# Patient Record
Sex: Male | Born: 1935 | Race: White | Hispanic: No | Marital: Married | Smoking: Never smoker
Health system: Southern US, Community
[De-identification: ages and names within clinical notes are randomized; demographics above are authoritative.]

## PROBLEM LIST (undated history)

## (undated) DIAGNOSIS — E119 Type 2 diabetes mellitus without complications: Secondary | ICD-10-CM

## (undated) DIAGNOSIS — I2510 Atherosclerotic heart disease of native coronary artery without angina pectoris: Secondary | ICD-10-CM

## (undated) HISTORY — PX: OTHER SURGICAL HISTORY: SHX169

---

## 2001-02-08 ENCOUNTER — Encounter: Payer: Self-pay | Admitting: Neurosurgery

## 2001-02-13 ENCOUNTER — Encounter: Payer: Self-pay | Admitting: Neurosurgery

## 2001-02-13 ENCOUNTER — Ambulatory Visit (HOSPITAL_COMMUNITY): Admission: RE | Admit: 2001-02-13 | Discharge: 2001-02-14 | Payer: Self-pay | Admitting: Neurosurgery

## 2003-01-30 ENCOUNTER — Ambulatory Visit (HOSPITAL_COMMUNITY): Admission: RE | Admit: 2003-01-30 | Discharge: 2003-01-30 | Payer: Self-pay | Admitting: Internal Medicine

## 2004-12-31 ENCOUNTER — Emergency Department: Payer: Self-pay | Admitting: Emergency Medicine

## 2005-10-17 ENCOUNTER — Other Ambulatory Visit: Payer: Self-pay

## 2005-10-18 ENCOUNTER — Inpatient Hospital Stay: Payer: Self-pay | Admitting: Internal Medicine

## 2005-10-18 ENCOUNTER — Other Ambulatory Visit: Payer: Self-pay

## 2005-10-19 ENCOUNTER — Other Ambulatory Visit: Payer: Self-pay

## 2006-07-29 ENCOUNTER — Ambulatory Visit: Payer: Self-pay | Admitting: Gastroenterology

## 2006-09-20 ENCOUNTER — Ambulatory Visit: Payer: Self-pay | Admitting: Gastroenterology

## 2007-07-14 ENCOUNTER — Ambulatory Visit: Payer: Self-pay

## 2007-07-27 ENCOUNTER — Ambulatory Visit: Payer: Self-pay | Admitting: Pain Medicine

## 2007-08-13 ENCOUNTER — Other Ambulatory Visit: Payer: Self-pay

## 2007-08-13 ENCOUNTER — Emergency Department: Payer: Self-pay | Admitting: Emergency Medicine

## 2007-09-15 ENCOUNTER — Ambulatory Visit: Payer: Self-pay | Admitting: Internal Medicine

## 2007-10-12 ENCOUNTER — Encounter: Payer: Self-pay | Admitting: Otolaryngology

## 2008-04-03 ENCOUNTER — Encounter: Payer: Self-pay | Admitting: Unknown Physician Specialty

## 2008-04-09 ENCOUNTER — Encounter: Payer: Self-pay | Admitting: Unknown Physician Specialty

## 2010-02-26 ENCOUNTER — Ambulatory Visit: Payer: Self-pay | Admitting: Gastroenterology

## 2010-03-02 LAB — PATHOLOGY REPORT

## 2011-03-23 ENCOUNTER — Ambulatory Visit: Payer: Self-pay | Admitting: Internal Medicine

## 2011-08-11 ENCOUNTER — Emergency Department: Payer: Self-pay | Admitting: Emergency Medicine

## 2011-08-11 LAB — URINALYSIS, COMPLETE
Bacteria: NONE SEEN
Bilirubin,UR: NEGATIVE
Blood: NEGATIVE
Glucose,UR: NEGATIVE mg/dL (ref 0–75)
Ketone: NEGATIVE
Leukocyte Esterase: NEGATIVE
Nitrite: NEGATIVE
Ph: 5 (ref 4.5–8.0)
Protein: NEGATIVE
RBC,UR: NONE SEEN /HPF (ref 0–5)
Specific Gravity: 1.012 (ref 1.003–1.030)
Squamous Epithelial: NONE SEEN
WBC UR: NONE SEEN /HPF (ref 0–5)

## 2011-08-11 LAB — CBC
HCT: 40.8 % (ref 40.0–52.0)
HGB: 13.8 g/dL (ref 13.0–18.0)
MCH: 31.9 pg (ref 26.0–34.0)
MCHC: 33.8 g/dL (ref 32.0–36.0)
MCV: 94 fL (ref 80–100)
Platelet: 199 10*3/uL (ref 150–440)
RBC: 4.34 10*6/uL — ABNORMAL LOW (ref 4.40–5.90)
RDW: 13.3 % (ref 11.5–14.5)
WBC: 9.4 10*3/uL (ref 3.8–10.6)

## 2011-08-11 LAB — COMPREHENSIVE METABOLIC PANEL
Albumin: 3.6 g/dL (ref 3.4–5.0)
Alkaline Phosphatase: 63 U/L (ref 50–136)
Anion Gap: 6 — ABNORMAL LOW (ref 7–16)
BUN: 19 mg/dL — ABNORMAL HIGH (ref 7–18)
Bilirubin,Total: 0.3 mg/dL (ref 0.2–1.0)
Calcium, Total: 8.9 mg/dL (ref 8.5–10.1)
Chloride: 104 mmol/L (ref 98–107)
Co2: 28 mmol/L (ref 21–32)
Creatinine: 1.22 mg/dL (ref 0.60–1.30)
EGFR (African American): >60
EGFR (Non-African Amer.): >60
Glucose: 185 mg/dL — ABNORMAL HIGH (ref 65–99)
Osmolality: 283 (ref 275–301)
Potassium: 3.9 mmol/L (ref 3.5–5.1)
SGOT(AST): 20 U/L (ref 15–37)
SGPT (ALT): 33 U/L
Sodium: 138 mmol/L (ref 136–145)
Total Protein: 6.8 g/dL (ref 6.4–8.2)

## 2011-08-11 LAB — CK TOTAL AND CKMB (NOT AT ARMC)
CK, Total: 137 U/L (ref 35–232)
CK-MB: 2.9 ng/mL (ref 0.5–3.6)

## 2011-08-11 LAB — TROPONIN I: Troponin-I: <0.02 ng/mL

## 2011-08-16 ENCOUNTER — Encounter: Payer: Self-pay | Admitting: Unknown Physician Specialty

## 2011-09-10 ENCOUNTER — Encounter: Payer: Self-pay | Admitting: Unknown Physician Specialty

## 2011-10-08 ENCOUNTER — Encounter: Payer: Self-pay | Admitting: Unknown Physician Specialty

## 2011-11-08 ENCOUNTER — Encounter: Payer: Self-pay | Admitting: Unknown Physician Specialty

## 2011-12-08 ENCOUNTER — Encounter: Payer: Self-pay | Admitting: Unknown Physician Specialty

## 2011-12-22 ENCOUNTER — Emergency Department: Payer: Self-pay | Admitting: Unknown Physician Specialty

## 2011-12-22 LAB — COMPREHENSIVE METABOLIC PANEL
Albumin: 3.4 g/dL (ref 3.4–5.0)
Alkaline Phosphatase: 92 U/L (ref 50–136)
Anion Gap: 8 (ref 7–16)
BUN: 15 mg/dL (ref 7–18)
Bilirubin,Total: 0.7 mg/dL (ref 0.2–1.0)
Calcium, Total: 8.4 mg/dL — ABNORMAL LOW (ref 8.5–10.1)
Co2: 29 mmol/L (ref 21–32)
Creatinine: 1.03 mg/dL (ref 0.60–1.30)
EGFR (African American): >60
EGFR (Non-African Amer.): >60
Glucose: 206 mg/dL — ABNORMAL HIGH (ref 65–99)
Osmolality: 268 (ref 275–301)
Potassium: 3.8 mmol/L (ref 3.5–5.1)
SGOT(AST): 39 U/L — ABNORMAL HIGH (ref 15–37)
SGPT (ALT): 40 U/L
Sodium: 130 mmol/L — ABNORMAL LOW (ref 136–145)
Total Protein: 6.7 g/dL (ref 6.4–8.2)

## 2011-12-22 LAB — CBC WITH DIFFERENTIAL/PLATELET
Basophil #: 0 10*3/uL (ref 0.0–0.1)
Eosinophil #: 0.1 10*3/uL (ref 0.0–0.7)
Eosinophil %: 0.5 %
HCT: 36.5 % — ABNORMAL LOW (ref 40.0–52.0)
HGB: 12.5 g/dL — ABNORMAL LOW (ref 13.0–18.0)
Lymphocyte #: 1.8 10*3/uL (ref 1.0–3.6)
MCH: 31.7 pg (ref 26.0–34.0)
MCHC: 34.3 g/dL (ref 32.0–36.0)
Neutrophil #: 10.7 10*3/uL — ABNORMAL HIGH (ref 1.4–6.5)
Neutrophil %: 74.7 %
Platelet: 167 10*3/uL (ref 150–440)
RBC: 3.95 10*6/uL — ABNORMAL LOW (ref 4.40–5.90)
RDW: 13.3 % (ref 11.5–14.5)
WBC: 14.3 10*3/uL — ABNORMAL HIGH (ref 3.8–10.6)

## 2011-12-22 LAB — URINALYSIS, COMPLETE
Bacteria: NONE SEEN
Bilirubin,UR: NEGATIVE
Ph: 6 (ref 4.5–8.0)
RBC,UR: <1 /HPF (ref 0–5)
Squamous Epithelial: NONE SEEN

## 2011-12-22 LAB — MAGNESIUM: Magnesium: 1.4 mg/dL — ABNORMAL LOW

## 2011-12-22 LAB — PROTIME-INR: INR: 1.1

## 2011-12-22 LAB — APTT: Activated PTT: 33.9 secs (ref 23.6–35.9)

## 2011-12-28 LAB — CULTURE, BLOOD (SINGLE)

## 2012-02-03 ENCOUNTER — Encounter: Payer: Self-pay | Admitting: Orthopedic Surgery

## 2012-02-07 ENCOUNTER — Encounter: Payer: Self-pay | Admitting: Orthopedic Surgery

## 2012-03-09 ENCOUNTER — Encounter: Payer: Self-pay | Admitting: Orthopedic Surgery

## 2012-04-09 ENCOUNTER — Encounter: Payer: Self-pay | Admitting: Orthopedic Surgery

## 2012-09-08 ENCOUNTER — Encounter: Payer: Self-pay | Admitting: Endocrinology

## 2012-09-12 ENCOUNTER — Encounter: Payer: Self-pay | Admitting: Internal Medicine

## 2012-09-13 ENCOUNTER — Ambulatory Visit: Payer: Self-pay | Admitting: Endocrinology

## 2012-10-27 ENCOUNTER — Ambulatory Visit: Payer: Self-pay | Admitting: Neurology

## 2012-11-08 ENCOUNTER — Ambulatory Visit: Payer: Self-pay | Admitting: Internal Medicine

## 2012-11-09 ENCOUNTER — Ambulatory Visit: Payer: Self-pay | Admitting: Internal Medicine

## 2012-11-11 ENCOUNTER — Ambulatory Visit: Payer: Self-pay | Admitting: Physical Medicine and Rehabilitation

## 2013-02-28 DIAGNOSIS — N138 Other obstructive and reflux uropathy: Secondary | ICD-10-CM | POA: Insufficient documentation

## 2013-02-28 DIAGNOSIS — N3941 Urge incontinence: Secondary | ICD-10-CM | POA: Insufficient documentation

## 2013-02-28 DIAGNOSIS — R350 Frequency of micturition: Secondary | ICD-10-CM | POA: Insufficient documentation

## 2013-02-28 DIAGNOSIS — R339 Retention of urine, unspecified: Secondary | ICD-10-CM | POA: Insufficient documentation

## 2013-02-28 DIAGNOSIS — N182 Chronic kidney disease, stage 2 (mild): Secondary | ICD-10-CM | POA: Insufficient documentation

## 2014-01-31 DIAGNOSIS — E559 Vitamin D deficiency, unspecified: Secondary | ICD-10-CM | POA: Insufficient documentation

## 2014-05-21 DIAGNOSIS — E119 Type 2 diabetes mellitus without complications: Secondary | ICD-10-CM | POA: Insufficient documentation

## 2014-05-21 DIAGNOSIS — E538 Deficiency of other specified B group vitamins: Secondary | ICD-10-CM | POA: Insufficient documentation

## 2014-07-23 ENCOUNTER — Ambulatory Visit: Payer: Self-pay | Admitting: Podiatry

## 2014-08-20 DIAGNOSIS — C44320 Squamous cell carcinoma of skin of unspecified parts of face: Secondary | ICD-10-CM | POA: Insufficient documentation

## 2014-10-15 DIAGNOSIS — F0390 Unspecified dementia without behavioral disturbance: Secondary | ICD-10-CM | POA: Insufficient documentation

## 2014-10-15 DIAGNOSIS — M5000 Cervical disc disorder with myelopathy, unspecified cervical region: Secondary | ICD-10-CM | POA: Insufficient documentation

## 2016-01-19 ENCOUNTER — Encounter: Payer: Self-pay | Admitting: Podiatry

## 2016-01-19 ENCOUNTER — Ambulatory Visit (INDEPENDENT_AMBULATORY_CARE_PROVIDER_SITE_OTHER): Payer: Medicare Other

## 2016-01-19 ENCOUNTER — Ambulatory Visit (INDEPENDENT_AMBULATORY_CARE_PROVIDER_SITE_OTHER): Payer: Medicare Other | Admitting: Podiatry

## 2016-01-19 VITALS — BP 133/71 | HR 65 | Resp 16

## 2016-01-19 DIAGNOSIS — B351 Tinea unguium: Secondary | ICD-10-CM

## 2016-01-19 DIAGNOSIS — E119 Type 2 diabetes mellitus without complications: Secondary | ICD-10-CM

## 2016-01-19 DIAGNOSIS — M79676 Pain in unspecified toe(s): Secondary | ICD-10-CM | POA: Diagnosis not present

## 2016-01-19 NOTE — Progress Notes (Signed)
   Subjective:    Patient ID: Larry Daugherty, male    DOB: [birth year], 82 y.o.   MRN: [id number]  HPI: He has a wife present today with a chief complaint of diabetes and painful toes bilateral. States that his toenails are long and painful in the [place]. They deny any trauma or any open wounds to his feet. Vitals    Review of Systems  Constitutional: Positive for fatigue.  HENT: Positive for hearing loss.   Eyes: Positive for itching and visual disturbance.  Respiratory: Positive for cough.   Gastrointestinal: Positive for diarrhea and abdominal distention.  Endocrine: Positive for polyuria.  Genitourinary: Positive for urgency.  Musculoskeletal: Positive for back pain.  Neurological: Positive for dizziness and weakness.  Psychiatric/Behavioral: Positive for confusion.  All other systems reviewed and are negative.      Objective:   Physical Exam: Vital signs are stable alert and oriented 3. Pulses are palpable. Neurologic systems intact. Degenerative flexion intact. Muscle strength is normal bilateral. Orthopedic evaluation demonstrates DISTAL ANKLE FULL RANGE OF MOTION WITHOUT CREPITATION. CUTANEOUS EVALUATION SHOWS SUPPLE HIGH-GRADE CUTIS NO ERYTHEMA EDEMA CELLULITIS OR DRAINAGE OR ODOR. HIS TOENAILS ARE THICK YELLOW DYSTROPHIC MYCOTIC AND PAINFUL PALPATION. NO OPEN LESIONS OR WOUNDS ARE NOTED.        Assessment & Plan:  Onychomycosis.  Plan: Debridement of toenails 1 through 5 bilateral. Non-complicated diabetes.

## 2016-04-26 ENCOUNTER — Encounter: Payer: Self-pay | Admitting: Podiatry

## 2016-04-26 ENCOUNTER — Ambulatory Visit (INDEPENDENT_AMBULATORY_CARE_PROVIDER_SITE_OTHER): Payer: Medicare Other | Admitting: Podiatry

## 2016-04-26 DIAGNOSIS — B351 Tinea unguium: Secondary | ICD-10-CM | POA: Diagnosis not present

## 2016-04-26 DIAGNOSIS — M79676 Pain in unspecified toe(s): Secondary | ICD-10-CM | POA: Diagnosis not present

## 2016-04-26 NOTE — Progress Notes (Signed)
He presents today with a chief complaint of painful elongated toenails 1 through 5 bilateral.  Objective: Toenails are thick yellow dystrophic with mycotic painful palpation 1 through 5 bilateral.  Assessment: Pain limb secondary to onychomycosis 1 through 5 bilateral.  Plan: Debridement of toenails 1 through 5 bilateral.

## 2016-07-28 ENCOUNTER — Ambulatory Visit (INDEPENDENT_AMBULATORY_CARE_PROVIDER_SITE_OTHER): Payer: Medicare Other | Admitting: Podiatry

## 2016-07-28 DIAGNOSIS — M79676 Pain in unspecified toe(s): Secondary | ICD-10-CM | POA: Diagnosis not present

## 2016-07-28 DIAGNOSIS — B351 Tinea unguium: Secondary | ICD-10-CM | POA: Diagnosis not present

## 2016-07-28 DIAGNOSIS — E1142 Type 2 diabetes mellitus with diabetic polyneuropathy: Secondary | ICD-10-CM

## 2016-07-28 DIAGNOSIS — E1151 Type 2 diabetes mellitus with diabetic peripheral angiopathy without gangrene: Secondary | ICD-10-CM

## 2016-07-29 ENCOUNTER — Telehealth: Payer: Self-pay | Admitting: *Deleted

## 2016-07-29 DIAGNOSIS — E1151 Type 2 diabetes mellitus with diabetic peripheral angiopathy without gangrene: Secondary | ICD-10-CM

## 2016-07-29 NOTE — Telephone Encounter (Addendum)
-----   Message from Mack Hook, LPN sent at [phone]  4:59 PM EST ----- Regarding: Vascular study and consult Ulcer left fibula and rest pain-needs vascular consult and studies. 07/29/2016-FAxed orders and referral with demographics and clinicals to [facility].

## 2016-07-29 NOTE — Progress Notes (Signed)
He presents today with chief complaint of painful elongated toenails 1 through 5 bilateral.  Objective: Toenails are thick yellow dystrophic onychomycotic no ulcerations or wounds are noted. He does have a very small superficial wound to the lateral ankle that bothers him at nighttime. Pulses are minimally palpable bilateral and his feet appear to be slightly mottled.  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 Pain in limb segment onychomycosis.  Plan: Debrided all reactive hyperkeratotic tissue debrided nails 1 through 5 bilateral. Also referred him to vascular clinic.

## 2016-08-11 ENCOUNTER — Emergency Department: Payer: Medicare Other

## 2016-08-11 ENCOUNTER — Encounter: Payer: Self-pay | Admitting: Emergency Medicine

## 2016-08-11 ENCOUNTER — Inpatient Hospital Stay
Admission: EM | Admit: 2016-08-11 | Discharge: 2016-09-09 | DRG: 441 | Disposition: E | Payer: Medicare Other | Attending: Internal Medicine | Admitting: Internal Medicine

## 2016-08-11 DIAGNOSIS — N182 Chronic kidney disease, stage 2 (mild): Secondary | ICD-10-CM | POA: Diagnosis present

## 2016-08-11 DIAGNOSIS — J811 Chronic pulmonary edema: Secondary | ICD-10-CM

## 2016-08-11 DIAGNOSIS — I5021 Acute systolic (congestive) heart failure: Secondary | ICD-10-CM | POA: Diagnosis present

## 2016-08-11 DIAGNOSIS — I255 Ischemic cardiomyopathy: Secondary | ICD-10-CM | POA: Diagnosis present

## 2016-08-11 DIAGNOSIS — B179 Acute viral hepatitis, unspecified: Secondary | ICD-10-CM

## 2016-08-11 DIAGNOSIS — E538 Deficiency of other specified B group vitamins: Secondary | ICD-10-CM | POA: Diagnosis present

## 2016-08-11 DIAGNOSIS — Z7984 Long term (current) use of oral hypoglycemic drugs: Secondary | ICD-10-CM | POA: Diagnosis not present

## 2016-08-11 DIAGNOSIS — N179 Acute kidney failure, unspecified: Secondary | ICD-10-CM | POA: Diagnosis present

## 2016-08-11 DIAGNOSIS — G9340 Encephalopathy, unspecified: Secondary | ICD-10-CM | POA: Diagnosis present

## 2016-08-11 DIAGNOSIS — G9349 Other encephalopathy: Secondary | ICD-10-CM | POA: Diagnosis not present

## 2016-08-11 DIAGNOSIS — I350 Nonrheumatic aortic (valve) stenosis: Secondary | ICD-10-CM | POA: Diagnosis present

## 2016-08-11 DIAGNOSIS — E1122 Type 2 diabetes mellitus with diabetic chronic kidney disease: Secondary | ICD-10-CM | POA: Diagnosis present

## 2016-08-11 DIAGNOSIS — Z888 Allergy status to other drugs, medicaments and biological substances status: Secondary | ICD-10-CM

## 2016-08-11 DIAGNOSIS — I2510 Atherosclerotic heart disease of native coronary artery without angina pectoris: Secondary | ICD-10-CM | POA: Diagnosis present

## 2016-08-11 DIAGNOSIS — F0390 Unspecified dementia without behavioral disturbance: Secondary | ICD-10-CM | POA: Diagnosis present

## 2016-08-11 DIAGNOSIS — R7401 Elevation of levels of liver transaminase levels: Secondary | ICD-10-CM

## 2016-08-11 DIAGNOSIS — K7200 Acute and subacute hepatic failure without coma: Principal | ICD-10-CM | POA: Diagnosis present

## 2016-08-11 DIAGNOSIS — R740 Nonspecific elevation of levels of transaminase and lactic acid dehydrogenase [LDH]: Secondary | ICD-10-CM | POA: Diagnosis present

## 2016-08-11 DIAGNOSIS — R7989 Other specified abnormal findings of blood chemistry: Secondary | ICD-10-CM | POA: Diagnosis not present

## 2016-08-11 DIAGNOSIS — Z515 Encounter for palliative care: Secondary | ICD-10-CM | POA: Diagnosis present

## 2016-08-11 DIAGNOSIS — Z7982 Long term (current) use of aspirin: Secondary | ICD-10-CM

## 2016-08-11 DIAGNOSIS — I509 Heart failure, unspecified: Secondary | ICD-10-CM

## 2016-08-11 DIAGNOSIS — R531 Weakness: Secondary | ICD-10-CM | POA: Diagnosis not present

## 2016-08-11 DIAGNOSIS — I21A1 Myocardial infarction type 2: Secondary | ICD-10-CM

## 2016-08-11 DIAGNOSIS — E875 Hyperkalemia: Secondary | ICD-10-CM | POA: Diagnosis present

## 2016-08-11 DIAGNOSIS — R945 Abnormal results of liver function studies: Secondary | ICD-10-CM

## 2016-08-11 DIAGNOSIS — R748 Abnormal levels of other serum enzymes: Secondary | ICD-10-CM

## 2016-08-11 DIAGNOSIS — Z66 Do not resuscitate: Secondary | ICD-10-CM | POA: Diagnosis present

## 2016-08-11 HISTORY — DX: Type 2 diabetes mellitus without complications: E11.9

## 2016-08-11 HISTORY — DX: Atherosclerotic heart disease of native coronary artery without angina pectoris: I25.10

## 2016-08-11 LAB — BLOOD GAS, ARTERIAL
ACID-BASE DEFICIT: 2.3 mmol/L — AB (ref 0.0–2.0)
Bicarbonate: 21.2 mmol/L (ref 20.0–28.0)
FIO2: 0.21
O2 SAT: 93.8 %
PATIENT TEMPERATURE: 37
PH ART: 7.43 (ref 7.350–7.450)
pCO2 arterial: 32 mmHg (ref 32.0–48.0)
pO2, Arterial: 68 mmHg — ABNORMAL LOW (ref 83.0–108.0)

## 2016-08-11 LAB — URINALYSIS, COMPLETE (UACMP) WITH MICROSCOPIC
Bacteria, UA: NONE SEEN
Bilirubin Urine: NEGATIVE
Hgb urine dipstick: NEGATIVE
Ketones, ur: NEGATIVE mg/dL
Leukocytes, UA: NEGATIVE
NITRITE: NEGATIVE
Protein, ur: 100 mg/dL — AB
Specific Gravity, Urine: 1.024 (ref 1.005–1.030)
pH: 5 (ref 5.0–8.0)

## 2016-08-11 LAB — RAPID INFLUENZA A&B ANTIGENS (ARMC ONLY): INFLUENZA B (ARMC): NEGATIVE

## 2016-08-11 LAB — COMPREHENSIVE METABOLIC PANEL
ALBUMIN: 3.6 g/dL (ref 3.5–5.0)
ALBUMIN: 3.3 g/dL — AB (ref 3.5–5.0)
ALK PHOS: 101 U/L (ref 38–126)
ALK PHOS: 106 U/L (ref 38–126)
ALK PHOS: 101 U/L (ref 38–126)
ALT: 1166 U/L — ABNORMAL HIGH (ref 17–63)
ALT: 1427 U/L — AB (ref 17–63)
ALT: 1820 U/L — AB (ref 17–63)
ANION GAP: 11 (ref 5–15)
ANION GAP: 11 (ref 5–15)
AST: 1455 U/L — ABNORMAL HIGH (ref 15–41)
AST: 1911 U/L — ABNORMAL HIGH (ref 15–41)
AST: 3018 U/L — AB (ref 15–41)
Albumin: 3.6 g/dL (ref 3.5–5.0)
Anion gap: 10 (ref 5–15)
BUN: 45 mg/dL — ABNORMAL HIGH (ref 6–20)
BUN: 46 mg/dL — ABNORMAL HIGH (ref 6–20)
BUN: 47 mg/dL — AB (ref 6–20)
CALCIUM: 9.5 mg/dL (ref 8.9–10.3)
CALCIUM: 9.6 mg/dL (ref 8.9–10.3)
CALCIUM: 9.2 mg/dL (ref 8.9–10.3)
CHLORIDE: 98 mmol/L — AB (ref 101–111)
CO2: 20 mmol/L — ABNORMAL LOW (ref 22–32)
CO2: 21 mmol/L — AB (ref 22–32)
CO2: 24 mmol/L (ref 22–32)
CREATININE: 1.45 mg/dL — AB (ref 0.61–1.24)
CREATININE: 1.55 mg/dL — AB (ref 0.61–1.24)
Chloride: 100 mmol/L — ABNORMAL LOW (ref 101–111)
Chloride: 99 mmol/L — ABNORMAL LOW (ref 101–111)
Creatinine, Ser: 1.49 mg/dL — ABNORMAL HIGH (ref 0.61–1.24)
GFR calc Af Amer: 49 mL/min — ABNORMAL LOW (ref >60)
GFR calc Af Amer: 46 mL/min — ABNORMAL LOW (ref >60)
GFR calc non Af Amer: 41 mL/min — ABNORMAL LOW (ref >60)
GFR calc non Af Amer: 43 mL/min — ABNORMAL LOW (ref >60)
GFR calc non Af Amer: 39 mL/min — ABNORMAL LOW (ref >60)
GFR, EST AFRICAN AMERICAN: 48 mL/min — AB (ref >60)
GLUCOSE: 285 mg/dL — AB (ref 65–99)
Glucose, Bld: 287 mg/dL — ABNORMAL HIGH (ref 65–99)
Glucose, Bld: 252 mg/dL — ABNORMAL HIGH (ref 65–99)
Potassium: 5.3 mmol/L — ABNORMAL HIGH (ref 3.5–5.1)
Potassium: 6.4 mmol/L (ref 3.5–5.1)
Potassium: 5.3 mmol/L — ABNORMAL HIGH (ref 3.5–5.1)
SODIUM: 131 mmol/L — AB (ref 135–145)
SODIUM: 131 mmol/L — AB (ref 135–145)
SODIUM: 132 mmol/L — AB (ref 135–145)
TOTAL PROTEIN: 7.2 g/dL (ref 6.5–8.1)
Total Bilirubin: 1.5 mg/dL — ABNORMAL HIGH (ref 0.3–1.2)
Total Bilirubin: 1.7 mg/dL — ABNORMAL HIGH (ref 0.3–1.2)
Total Bilirubin: 1.2 mg/dL (ref 0.3–1.2)
Total Protein: 7.5 g/dL (ref 6.5–8.1)
Total Protein: 6.7 g/dL (ref 6.5–8.1)

## 2016-08-11 LAB — TROPONIN I
TROPONIN I: 1.29 ng/mL — AB (ref <0.03)
Troponin I: 1.23 ng/mL (ref <0.03)

## 2016-08-11 LAB — CBC WITH DIFFERENTIAL/PLATELET
Basophils Absolute: 0 10*3/uL (ref 0–0.1)
Basophils Relative: 0 %
EOS ABS: 0 10*3/uL (ref 0–0.7)
Eosinophils Relative: 0 %
HCT: 39.4 % — ABNORMAL LOW (ref 40.0–52.0)
HEMOGLOBIN: 13.2 g/dL (ref 13.0–18.0)
LYMPHS ABS: 1.2 10*3/uL (ref 1.0–3.6)
Lymphocytes Relative: 7 %
MCH: 31.7 pg (ref 26.0–34.0)
MCHC: 33.5 g/dL (ref 32.0–36.0)
MCV: 94.4 fL (ref 80.0–100.0)
MONO ABS: 1.7 10*3/uL — AB (ref 0.2–1.0)
Monocytes Relative: 9 %
NEUTROS PCT: 84 %
Neutro Abs: 16.1 10*3/uL — ABNORMAL HIGH (ref 1.4–6.5)
Platelets: 263 10*3/uL (ref 150–440)
RBC: 4.17 MIL/uL — ABNORMAL LOW (ref 4.40–5.90)
RDW: 13.8 % (ref 11.5–14.5)
WBC: 19.1 10*3/uL — ABNORMAL HIGH (ref 3.8–10.6)

## 2016-08-11 LAB — ACETAMINOPHEN LEVEL: Acetaminophen (Tylenol), Serum: <10 ug/mL — ABNORMAL LOW (ref 10–30)

## 2016-08-11 LAB — GLUCOSE, CAPILLARY
GLUCOSE-CAPILLARY: 212 mg/dL — AB (ref 65–99)
Glucose-Capillary: 282 mg/dL — ABNORMAL HIGH (ref 65–99)
Glucose-Capillary: 231 mg/dL — ABNORMAL HIGH (ref 65–99)
Glucose-Capillary: 247 mg/dL — ABNORMAL HIGH (ref 65–99)

## 2016-08-11 LAB — LACTIC ACID, PLASMA
LACTIC ACID, VENOUS: 3 mmol/L — AB (ref 0.5–1.9)
Lactic Acid, Venous: 3 mmol/L (ref 0.5–1.9)

## 2016-08-11 LAB — LIPASE, BLOOD: Lipase: 30 U/L (ref 11–51)

## 2016-08-11 LAB — PROCALCITONIN: PROCALCITONIN: 0.17 ng/mL

## 2016-08-11 LAB — AMMONIA: AMMONIA: 20 umol/L (ref 9–35)

## 2016-08-11 LAB — CK: Total CK: 42 U/L — ABNORMAL LOW (ref 49–397)

## 2016-08-11 LAB — BRAIN NATRIURETIC PEPTIDE: B Natriuretic Peptide: 2992 pg/mL — ABNORMAL HIGH (ref 0.0–100.0)

## 2016-08-11 LAB — RAPID INFLUENZA A&B ANTIGENS: Influenza A (ARMC): NEGATIVE

## 2016-08-11 LAB — SALICYLATE LEVEL: Salicylate Lvl: <7 mg/dL (ref 2.8–30.0)

## 2016-08-11 LAB — MRSA PCR SCREENING: MRSA BY PCR: NEGATIVE

## 2016-08-11 LAB — APTT: aPTT: 30 seconds (ref 24–36)

## 2016-08-11 LAB — PROTIME-INR
INR: 2.06
PROTHROMBIN TIME: 23.5 s — AB (ref 11.4–15.2)

## 2016-08-11 LAB — AMYLASE: AMYLASE: 33 U/L (ref 28–100)

## 2016-08-11 MED ORDER — TIMOLOL MALEATE 0.5 % OP SOLN
1.0000 [drp] | Freq: Every day | OPHTHALMIC | Status: DC
  Administered 2016-08-11: 1 [drp] via OPHTHALMIC
  Filled 2016-08-11: qty 5

## 2016-08-11 MED ORDER — SODIUM CHLORIDE 0.9 % IV SOLN: 250.0000 mL | INTRAVENOUS | Status: DC | PRN

## 2016-08-11 MED ORDER — VITAMIN K1 10 MG/ML IJ SOLN
10.0000 mg | Freq: Every day | INTRAMUSCULAR | Status: DC
Start: 2016-08-11 — End: 2016-08-12
  Administered 2016-08-11: 10 mg via SUBCUTANEOUS
  Filled 2016-08-11: qty 1

## 2016-08-11 MED ORDER — SODIUM CHLORIDE 0.9 % IV SOLN
2.0000 g | INTRAVENOUS | Status: AC
  Administered 2016-08-11: 2 g via INTRAVENOUS
  Filled 2016-08-11: qty 2000

## 2016-08-11 MED ORDER — ONDANSETRON HCL 4 MG/2ML IJ SOLN: 4.0000 mg | Freq: Four times a day (QID) | INTRAMUSCULAR | Status: DC | PRN

## 2016-08-11 MED ORDER — LATANOPROST 0.005 % OP SOLN
1.0000 [drp] | Freq: Every day | OPHTHALMIC | Status: DC
  Administered 2016-08-11: 1 [drp] via OPHTHALMIC
  Filled 2016-08-11: qty 2.5

## 2016-08-11 MED ORDER — IPRATROPIUM-ALBUTEROL 0.5-2.5 (3) MG/3ML IN SOLN: 3.0000 mL | RESPIRATORY_TRACT | Status: DC | PRN

## 2016-08-11 MED ORDER — DEXTROSE 5 % IV SOLN
2.0000 g | Freq: Two times a day (BID) | INTRAVENOUS | Status: DC
  Administered 2016-08-12: 2 g via INTRAVENOUS
  Filled 2016-08-11 (×2): qty 2

## 2016-08-11 MED ORDER — SODIUM CHLORIDE 0.9 % IV BOLUS (SEPSIS)
500.0000 mL | Freq: Once | INTRAVENOUS | Status: AC
  Administered 2016-08-11: 500 mL via INTRAVENOUS

## 2016-08-11 MED ORDER — VANCOMYCIN HCL IN DEXTROSE 1-5 GM/200ML-% IV SOLN
1000.0000 mg | INTRAVENOUS | Status: DC
  Administered 2016-08-12: 1000 mg via INTRAVENOUS
  Filled 2016-08-11 (×3): qty 200

## 2016-08-11 MED ORDER — INSULIN ASPART 100 UNIT/ML ~~LOC~~ SOLN
0.0000 [IU] | SUBCUTANEOUS | Status: DC
  Administered 2016-08-11 (×3): 3 [IU] via SUBCUTANEOUS
  Administered 2016-08-12 (×2): 2 [IU] via SUBCUTANEOUS
  Filled 2016-08-11 (×2): qty 2
  Filled 2016-08-11 (×3): qty 3

## 2016-08-11 MED ORDER — DEXTROSE 5 % IV SOLN
2.0000 g | INTRAVENOUS | Status: AC
  Administered 2016-08-11: 2 g via INTRAVENOUS
  Filled 2016-08-11: qty 2

## 2016-08-11 MED ORDER — VANCOMYCIN HCL IN DEXTROSE 1-5 GM/200ML-% IV SOLN
1000.0000 mg | INTRAVENOUS | Status: AC
  Administered 2016-08-11: 1000 mg via INTRAVENOUS
  Filled 2016-08-11: qty 200

## 2016-08-11 MED ORDER — SODIUM CHLORIDE 0.9 % IV SOLN
INTRAVENOUS | Status: DC
  Administered 2016-08-11: 18:00:00 via INTRAVENOUS

## 2016-08-11 MED ORDER — SODIUM CHLORIDE 0.9 % IV SOLN
2.0000 g | Freq: Four times a day (QID) | INTRAVENOUS | Status: DC
  Administered 2016-08-11 – 2016-08-12 (×2): 2 g via INTRAVENOUS
  Filled 2016-08-11 (×5): qty 2000

## 2016-08-11 NOTE — ED Triage Notes (Signed)
Patient presents to the ED with worsening weakness over the past week.  Patient brought to ED via [facility] from home.  Patient told EMS he felt like he might pass out.  Patient appears pale.  Alert and oriented x 4 at this time.  Per EMS patient was unable to walk this morning which is not baseline.

## 2016-08-11 NOTE — ED Provider Notes (Signed)
[facility] Emergency Department Provider Note  ____________________________________________  Time seen: Approximately 11:41 AM  I have reviewed the triage vital signs and the nursing notes.   HISTORY  Chief Complaint Extremity Weakness  Level 5 caveat:  Portions of the history and physical were unable to be obtained due to ams, dementia   HPI Larry Daugherty is a 83 y.o. male with h/o CAD, CHF (EF 30%), dementia, B12 deficiency, CKD, DM presents for evaluation of generalized weakness. History is gathered mostly from the wife. She reports the patient has been having progressively worsening weakness for the last 5 days. Over the course of the last 3 days he hasn't eaten or drank anything. He has been sleeping almost all day long. Wife is been having a hard time moving the patient around in the house. He had night sweats for the last 2 nights. Patient is very sleepy and difficulty to arouse but when awake tells me that his head hurts, his chest hurts, he feels short of breath, his abdomen hurts and his whole body hurts. Patient can give me any more details. The wife is not sure if the patient has had any fevers at home. No vomiting or diarrhea as far as she knows. This morning she couldn't wake him up and that when she called EMS.  Past Medical History:  Diagnosis Date  . Coronary artery disease   . Diabetes mellitus without complication [facility])     Patient Active Problem List   Diagnosis Date Noted  . Cervical disc disease with myelopathy 10/15/2014  . Dementia 10/15/2014  . SCC (squamous cell carcinoma), face 08/20/2014  . B12 deficiency 05/21/2014  . Type 2 diabetes mellitus ([place]) 05/21/2014  . Avitaminosis D 01/31/2014  . Benign prostatic hyperplasia with urinary obstruction 02/28/2013  . Chronic kidney disease (CKD), stage II (mild) 02/28/2013  . Incomplete bladder emptying 02/28/2013  . Urge incontinence of urine 02/28/2013  . FOM (frequency of  micturition) 02/28/2013    Past Surgical History:  Procedure Laterality Date  . cardiac bypass      Prior to Admission medications   Medication Sig Start Date End Date Taking? Authorizing Provider  aspirin EC 81 MG tablet Take 81 mg by mouth daily.   Yes Historical Provider, MD  ipratropium (ATROVENT) 0.03 % nasal spray Place 2 sprays into the nose 3 (three) times daily before meals.   Yes Historical Provider, MD  latanoprost (XALATAN) 0.005 % ophthalmic solution Place 1 drop into both eyes at bedtime.  01/15/16  Yes Historical Provider, MD  memantine (NAMENDA) 10 MG tablet Take 10 mg by mouth daily.  01/15/16  Yes Historical Provider, MD  metFORMIN (GLUCOPHAGE) 500 MG tablet Take 500 mg by mouth 2 (two) times daily with a meal.  12/13/15  Yes Historical Provider, MD  timolol (TIMOPTIC) 0.5 % ophthalmic solution Place 1 drop into both eyes at bedtime.  12/16/15  Yes Historical Provider, MD    Allergies Atorvastatin; Cholesterol; Erythromycin base; Hydralazine; Lisinopril; Erythromycin; and Statins  No family history on file.  Social History Social History  Substance Use Topics  . Smoking status: Unknown If Ever Smoked  . Smokeless tobacco: Never Used  . Alcohol use No    Review of Systems Constitutional: Negative for fever. + AMS, night sweats, generalized weakness Eyes: Negative for visual changes. ENT: Negative for sore throat. Neck: No neck pain  Cardiovascular: + chest pain. Respiratory: + shortness of breath. Gastrointestinal: + abdominal pain. No vomiting or diarrhea. Genitourinary:  Negative for dysuria. Musculoskeletal: + back pain. Skin: Negative for rash. Neurological: Negative for weakness or numbness. + HA Psych: No SI or HI  ____________________________________________   PHYSICAL EXAM:  VITAL SIGNS: ED Triage Vitals  Enc Vitals Group     BP 08/31/2016 1112 (!) 150/94     Pulse Rate 08/26/2016 1112 84     Resp 08/25/2016 1112 18     Temp 09/03/2016 1119 97.9 F  (36.6 C)     Temp Source 08/27/2016 1112 Oral     SpO2 08/16/2016 1112 95 %     Weight 08/28/2016 1113 184 lb 4.9 oz (83.6 kg)     Height 09/07/2016 1113 5\' 10"  (1.778 m)     Head Circumference --      Peak Flow --      Pain Score 08/22/2016 1114 0     Pain Loc --      Pain Edu? --      Excl. in [place]? --     Constitutional: Sleepy and arouses with a sternal rub, is able to answer questions, does not know where he is, no distress. HEENT:      Head: Normocephalic and atraumatic.         Eyes: Conjunctivae are normal. Sclera is non-icteric. EOMI. PERRL      Mouth/Throat: Mucous membranes are dry.       Neck: Supple with no signs of meningismus. Cardiovascular: Regular rate and rhythm. No murmurs, gallops, or rubs. 2+ symmetrical distal pulses are present in all extremities. No JVD. Respiratory: Normal respiratory effort. Lungs are clear to auscultation bilaterally. No wheezes, crackles, or rhonchi.  Gastrointestinal: Soft, non tender, and non distended with positive bowel sounds. No rebound or guarding. Musculoskeletal: Nontender with normal range of motion in all extremities. No edema, cyanosis, or erythema of extremities. Neurologic: Normal speech and language. Face is symmetric. Moving all extremities. No gross focal neurologic deficits are appreciated. Skin: Skin is warm, dry and intact. No rash noted. Psychiatric: Mood and affect are normal. Speech and behavior are normal.  ____________________________________________   LABS (all labs ordered are listed, but only abnormal results are displayed)  Labs Reviewed  CBC WITH DIFFERENTIAL/PLATELET - Abnormal; Notable for the following:       Result Value   WBC 19.1 (*)    RBC 4.17 (*)    HCT 39.4 (*)    Neutro Abs 16.1 (*)    Monocytes Absolute 1.7 (*)    All other components within normal limits  COMPREHENSIVE METABOLIC PANEL - Abnormal; Notable for the following:    Sodium 131 (*)    Potassium 6.4 (*)    Chloride 100 (*)    CO2 20 (*)     Glucose, Bld 287 (*)    BUN 45 (*)    Creatinine, Ser 1.49 (*)    AST 1,455 (*)    ALT 1,166 (*)    Total Bilirubin 1.7 (*)    GFR calc non Af Amer 41 (*)    GFR calc Af Amer 48 (*)    All other components within normal limits  TROPONIN I - Abnormal; Notable for the following:    Troponin I 1.23 (*)    All other components within normal limits  GLUCOSE, CAPILLARY - Abnormal; Notable for the following:    Glucose-Capillary 282 (*)    All other components within normal limits  BRAIN NATRIURETIC PEPTIDE - Abnormal; Notable for the following:    B Natriuretic Peptide 2,992.0 (*)  All other components within normal limits  COMPREHENSIVE METABOLIC PANEL - Abnormal; Notable for the following:    Sodium 131 (*)    Potassium 5.3 (*)    Chloride 99 (*)    CO2 21 (*)    Glucose, Bld 285 (*)    BUN 46 (*)    Creatinine, Ser 1.45 (*)    AST 1,911 (*)    ALT 1,427 (*)    Total Bilirubin 1.5 (*)    GFR calc non Af Amer 43 (*)    GFR calc Af Amer 49 (*)    All other components within normal limits  ACETAMINOPHEN LEVEL - Abnormal; Notable for the following:    Acetaminophen (Tylenol), Serum <10 (*)    All other components within normal limits  PROTIME-INR - Abnormal; Notable for the following:    Prothrombin Time 23.5 (*)    All other components within normal limits  RAPID INFLUENZA A&B ANTIGENS ([facility] ONLY)  APTT  URINALYSIS, COMPLETE (UACMP) WITH MICROSCOPIC  HEPATITIS PANEL, ACUTE  HERPES SIMPLEX VIRUS(HSV) DNA BY PCR  EPSTEIN-BARR VIRUS VCA, IGM  CMV DNA BY PCR, QUALITATIVE  HEPATITIS C VRS RNA DETECT BY PCR-QUAL  HEPATITIS B DNA, ULTRAQUANTITATIVE, PCR   ____________________________________________  EKG  ED ECG REPORT I, Rudene Re, the attending physician, personally viewed and interpreted this ECG.  Normal sinus rhythm, rate of 83, normal intervals, normal axis, T-wave inversion in the lateral leads, no ST elevations. Inversions are new when compared to  prior. ____________________________________________  RADIOLOGY  Head CT: No acute intracranial abnormality. Moderate cerebral atrophy. Periventricular and patchy subcortical white matter decreased attenuation probable due to chronic small vessel ischemic changes. No definite acute cortical infarction. Atherosclerotic calcifications of carotid siphon and vertebral arteries.  CXR: Cardiomegaly with vascular congestion. Bilateral perihilar and lower lobe opacities likely reflect edema/CHF.  RUQ [place]: Negative limited ultrasound of the abdomen post cholecystectomy other than trace ascites and bilateral pleural effusions. ____________________________________________   PROCEDURES  Procedure(s) performed: None Procedures Critical Care performed: yes  CRITICAL CARE Performed by: Rudene Re  ?  Total critical care time: 40 min  Critical care time was exclusive of separately billable procedures and treating other patients.  Critical care was necessary to treat or prevent imminent or life-threatening deterioration.  Critical care was time spent personally by me on the following activities: development of treatment plan with patient and/or surrogate as well as nursing, discussions with consultants, evaluation of patient's response to treatment, examination of patient, obtaining history from patient or surrogate, ordering and performing treatments and interventions, ordering and review of laboratory studies, ordering and review of radiographic studies, pulse oximetry and re-evaluation of patient's condition.  ____________________________________________   INITIAL IMPRESSION / ASSESSMENT AND PLAN / ED COURSE   83 y.o. male with h/o CAD, dementia, B12 deficiency, CKD, DM presents for evaluation of progressively worsening generalized weakness, decreased PO intake. The patient is grossly neurologically intact with symmetric face, moving all 4 extremities. He is very difficult to arouse  but when he does arouse and answers questions appropriately. He is confused but in no distress. His EKG shows no T-wave inversions in the lateral leads. We'll get a head CT to rule out intracranial process. Chest x-ray and flew to rule out pneumonia or influenza. We'll check blood work to evaluate for signs of anemia, electrolyte abnormalities, dehydration. We'll watch patient on telemetry.  Clinical Course as of Aug 11 1549  Wed Aug 11, 2016  1222 K 6.6 and LFTs in the 1000K  however hemolysis noted. No changes of hyperkalemia on EKG. Will repeat CMP.  [CV]  1403 Repeat CMP showed potassium of 5.3 and persistent transaminitis in the 1000's. Tylenol level is undetectable. Influenza is negative. Hepatitis panel is pending sore coags. Patient also has AKA I, CHF exacerbation, and type II and STEMI. Right upper quadrant ultrasound with no acute findings. Negative head CT as well. I spoke with Dr. Vicente Males from GI who recommends admission here as long as INR<2.0  [CV]  1510 Spoke with Dr. Vicente Males who recommended transfer to a liver transplant facility since patient's INR is 2.06 and his encephalopathic. I spoke with Dr. Monica Martinez, [facility] liver team who told me the patient is not going to be a transplant candidate based on his cardiac function. Therefore he needs ICU for supportive care for shock liver. Spoke again with Dr. Vicente Males who agrees with admission to ICU for supportive care and will follow patient in house. ICU paged for admission.  [CV]  [id number] Patient accepted by ICU here. Family updated on patient's critical condition.  [CV]    Clinical Course User Index [CV] Rudene Re, MD    Pertinent labs & imaging results that were available during my care of the patient were reviewed by me and considered in my medical decision making (see chart for details).    ____________________________________________   FINAL CLINICAL IMPRESSION(S) / ED DIAGNOSES  Final diagnoses:  Elevated LFTs  Transaminitis   Hyperkalemia  Encephalopathy  AKI (acute kidney injury) ([place])  Non-ST elevation myocardial infarction (NSTEMI), type 2 (HCC)  Acute on chronic congestive heart failure, unspecified congestive heart failure type ([place])  Shock liver      NEW MEDICATIONS STARTED DURING THIS VISIT:  New Prescriptions   No medications on file     Note:  This document was prepared using Dragon voice recognition software and may include unintentional dictation errors.    Rudene Re, MD 08/12/2016 [phone]

## 2016-08-11 NOTE — H&P (Signed)
PULMONARY / CRITICAL CARE MEDICINE   Name: Larry Daugherty MRN: [id number] DOB: 1930/01/02    ADMISSION DATE:  08/20/2016 CONSULTATION DATE:  08/11/2016  REFERRING MD:  Dr. Alfred Levins   CHIEF COMPLAINT:  Extremity Weakness  HISTORY OF PRESENT ILLNESS:   This is an 83 yo male with a PMH of CAD, CHF (EF 30%), Dementia, B12 deficiency, CKD, Cholecystectomy (based on [place] of abdomen results 08/11/16 unsure of when the cholecystectomy was performed), and Diabetes Mellitus.  He presented to [facility] ER 01/3 with generalized weakness and altered mental status.  According to pts wife the pt has had progressive weakness over the past 5 days and over the past 3 days he has not eaten or drank any fluids.  His wife states he has been sleeping all day for the past several days and has been complaining of generalized pain and c/o shortness of breath.  She states he has had night sweats over the past 2 days, but is unsure if he has had fevers.  This morning 01/3 she was unable to wake the pt up prompting her to call EMS.  PCCM contacted to admit the pt to ICU due to acute encephalopathy and acutely elevated liver enzymes.  Gastroenterology has been consulted per consult note due to multiple comorbidities pt is not a candidate for liver transplant if he were to deteriorate further-ER discussed with [facility] for transfer, however they did not accept the pt.   PAST MEDICAL HISTORY :  He  has a past medical history of Coronary artery disease and Diabetes mellitus without complication ([place]).  PAST SURGICAL HISTORY: He  has a past surgical history that includes cardiac bypass.  Allergies  Allergen Reactions  . Atorvastatin Anaphylaxis and Other (See Comments)    SEDATING  . Cholesterol Other (See Comments)           Allergic to    :    CHOLESTEROL MEDICINE (unspecified reaction)  . Erythromycin Base Other (See Comments)    "I didn't feel good"  . Hydralazine Rash  . Lisinopril Other (See Comments)    Acute renal failure  .  Erythromycin Other (See Comments)  . Statins Other (See Comments)    Almost killed him    No current facility-administered medications on file prior to encounter.    Current Outpatient Prescriptions on File Prior to Encounter  Medication Sig  . latanoprost (XALATAN) 0.005 % ophthalmic solution Place 1 drop into both eyes at bedtime.   . memantine (NAMENDA) 10 MG tablet Take 10 mg by mouth daily.   . metFORMIN (GLUCOPHAGE) 500 MG tablet Take 500 mg by mouth 2 (two) times daily with a meal.   . timolol (TIMOPTIC) 0.5 % ophthalmic solution Place 1 drop into both eyes at bedtime.     FAMILY HISTORY:  His has no family status information on file.    SOCIAL HISTORY: He  has an unknown smoking status. He has never used smokeless tobacco. He reports that he does not drink alcohol.  REVIEW OF SYSTEMS:   Unable to assess pt falling asleep when attempting to ask questions.    SUBJECTIVE:  Pt stating he "wants to go back to sleep"  VITAL SIGNS: BP (!) 143/97   Pulse 81   Temp 97.9 F (36.6 C) (Axillary)   Resp 15   Ht 5\' 10"  (1.778 m)   Wt 184 lb 4.9 oz (83.6 kg)   SpO2 93%   BMI 26.44 kg/m   HEMODYNAMICS:    VENTILATOR  SETTINGS:    INTAKE / OUTPUT: No intake/output data recorded.  PHYSICAL EXAMINATION: General:  Acutely ill appearing Caucasian male  Neuro:  Lethargic, following commands, PERRLA  HEENT:  Supple, no signs of neck rigidity or pain,  no JVD Cardiovascular:  NSR, s1s2, no M/R/G Lungs:  Shallow respirations clear throughout, even, non labored  Abdomen:  +BS x4, soft, non tender, non distended Musculoskeletal:  Normal bulk and tone, no edema Skin:  Intact no rashes or lesions  LABS:  BMET  Recent Labs Lab 08/10/2016 1137 09/04/2016 1225  NA 131* 131*  K 6.4* 5.3*  CL 100* 99*  CO2 20* 21*  BUN 45* 46*  CREATININE 1.49* 1.45*  GLUCOSE 287* 285*    Electrolytes  Recent Labs Lab 08/10/2016 1137 08/19/2016 1225  CALCIUM 9.5 9.6    CBC  Recent  Labs Lab 08/31/2016 1137  WBC 19.1*  HGB 13.2  HCT 39.4*  PLT 263    Coag's  Recent Labs Lab 08/28/2016 1408  APTT 30  INR 2.06    Sepsis Markers No results for input(s): LATICACIDVEN, PROCALCITON, O2SATVEN in the last 168 hours.  ABG No results for input(s): PHART, PCO2ART, PO2ART in the last 168 hours.  Liver Enzymes  Recent Labs Lab 08/16/2016 1137 08/16/2016 1225  AST 1,455* 1,911*  ALT 1,166* 1,427*  ALKPHOS 101 106  BILITOT 1.7* 1.5*  ALBUMIN 3.6 3.6    Cardiac Enzymes  Recent Labs Lab 09/03/2016 1137  TROPONINI 1.23*    Glucose  Recent Labs Lab 08/30/2016 1147  GLUCAP 282*    Imaging Dg Chest 2 View  Result Date: 09/03/2016 CLINICAL DATA:  Altered mental status.  Weakness. EXAM: CHEST  2 VIEW COMPARISON:  12/22/2011 FINDINGS: Prior CABG. Cardiomegaly with vascular congestion and bilateral perihilar and lower lobe opacities compatible with edema. Small bilateral effusions. No acute bony abnormality. IMPRESSION: Cardiomegaly with vascular congestion. Bilateral perihilar and lower lobe opacities likely reflect edema/CHF. Electronically Signed   By: Rolm Baptise M.D.   On: 08/28/2016 12:09   Ct Head Wo Contrast  Result Date: 08/10/2016 CLINICAL DATA:  Altered mental status EXAM: CT HEAD WITHOUT CONTRAST TECHNIQUE: Contiguous axial images were obtained from the base of the skull through the vertex without intravenous contrast. COMPARISON:  Brain MRI 10/27/2012 FINDINGS: Brain: No intracranial hemorrhage, mass effect or midline shift. Moderate cerebral atrophy. Periventricular and patchy subcortical white matter decreased attenuation probable due to chronic small vessel ischemic changes. No acute cortical infarction. No mass lesion is noted on this unenhanced scan. Vascular: Atherosclerotic calcifications of carotid siphon and vertebral arteries. Skull: Normal. Negative for fracture or focal lesion. Sinuses/Orbits: No acute finding. Other: None. IMPRESSION: No acute  intracranial abnormality. Moderate cerebral atrophy. Periventricular and patchy subcortical white matter decreased attenuation probable due to chronic small vessel ischemic changes. No definite acute cortical infarction. Atherosclerotic calcifications of carotid siphon and vertebral arteries. Electronically Signed   By: Lahoma Crocker M.D.   On: 08/22/2016 12:21   Dg Shoulder Left  Result Date: 08/17/2016 CLINICAL DATA:  Weakness.  Fall.  Pain. EXAM: LEFT SHOULDER - 2+ VIEW COMPARISON:  No recent. FINDINGS: No acute bony or joint abnormality identified. No evidence of fracture or dislocation. Acromioclavicular glenohumeral degenerative change noted. IMPRESSION: Acromioclavicular and glenohumeral degenerative change. No acute abnormality. Electronically Signed   By: Marcello Moores  Register   On: 08/12/2016 12:10   US Abdomen Limited Ruq  Result Date: 08/21/2016 CLINICAL DATA:  Elevated liver function tests, prior cholecystectomy EXAM: US ABDOMEN LIMITED - RIGHT  UPPER QUADRANT COMPARISON:  None. FINDINGS: Gallbladder: The gallbladder has previously been resected. Common bile duct: Diameter: The common bile duct measures 4.4 mm. Liver: The liver has a normal echogenic pattern. No focal hepatic abnormality is seen. There is a trace amount of ascites present. Bilateral pleural effusions also are noted. IMPRESSION: Negative limited ultrasound of the abdomen post cholecystectomy other than trace ascites and bilateral pleural effusions. Electronically Signed   By: Ivar Drape M.D.   On: 08/15/2016 13:38   STUDIES:  CT Head 01/3>>No acute intracranial abnormality. Moderate cerebral atrophy. Periventricular and patchy subcortical white matter decreased attenuation probable due to chronic small vessel ischemic changes. No definite acute cortical infarction. Atherosclerotic calcifications of carotid siphon and vertebral arteries. [place] Abd 01/3>>Negative limited ultrasound of the abdomen post cholecystectomy other than trace  ascites and bilateral pleural effusions.  CULTURES: Blood x2 01/3>> Urine 01/3>> Influenza A&B 01/3>>negative   ANTIBIOTICS: Ampicillin 01/3>> Ceftriaxone 01/3>> Vancomycin 01/3>>  SIGNIFICANT EVENTS: 01/3-Pt admitted to [facility] ICU with acute encephalopathy and elevated liver enzymes   LINES/TUBES: PIV's x2 01/3>>  ASSESSMENT / PLAN:  PULMONARY A: Pulmonary edema vs aspiration Pneumonia  P:   Supplemental O2 to maintain O2 sats >92% ABG today 01/3 then prn  CXR in am 01/4 Continue abx as listed above  CARDIOVASCULAR A:  Elevated troponin's demand ischemia vs. NSTEMI Hx: CAD and CHF (EF 30%) P:  Trend troponin's Echo pending Cardiology consulted appreciate input Continuous telemetry monitoring   RENAL A:   Acute on chronic renal failure  Hyperkalemia  P:   Repeat CMP today 01/3 Replace electrolytes as indicated Monitor UOP   GASTROINTESTINAL A:   Elevated LFT's secondary primary liver injury P:   Repeat CMP today 01/3 Obtain acute hepatitis panel, urine drug screen, amylase, lipase, and ck levels  Gastroenterology consulted appreciate input  Keep NPO for now   HEMATOLOGIC A:   Elevated INR P:  Avoid chemical VTE prophylaxis; SCD's for VTE prophylaxis  Trend PT/INR and CBC's  Monitor for s/sx of bleeding  Transfuse for hgb <7  INFECTIOUS A:   Leukocytosis secondary to ?Meningitis vs. ?Aspiration Pneumonia P:   Trend WBC's and monitor fever curve  Trend PCT's and lactic acid  Droplet precautions Continue abx as listed above Follow cultures   ENDOCRINE A:   Diabetes Mellitus  P:   CBG's q4 hrs SSI  NEUROLOGIC A:   Acute encephalopathy Hx: Dementia  P:   Avoid sedating medications Obtain ammonia level  Frequent reorientation Lights on during the day Promote family presence at bedside  FAMILY  - Updates: Pts wife updated about plan of care and questions answered 08/11/2016  - Inter-disciplinary family meet or Palliative Care  meeting due by:  08/18/2016   Marda Stalker, [place] Pager [phone] (please enter 7 digits) PCCM Consult Pager [phone] (please enter 7 digits)  Pt seen and examined with NP, agree with assessment and plan as amended.  Elevated LFT possible viral hepatitis vs portal v. Thrombosis. Discussed with wife/friends at bedside. Pt currently has significant RUQ tenderness. Pt notes that patient has advanced dementia, and is dependent on others for care, he is "ready to go to [facility]" and they do not want further measures at this time. They see that he is in pain and uncomfortable and would like him to be kept comfortable.  I discussed comfort measures with them, and they understand that if no measures are taken he may pass away. Therefore discussed transition to comfort measures which  they were agreement, orders placed. Discussed with Dr. Tressia Miners regarding transfer.   Marda Stalker, M.D.  08/12/2016  [place].  I have personally obtained a history, examined the patient, evaluated laboratory and imaging results, formulated the assessment and plan and placed orders. The Patient requires high complexity decision making for assessment and support, frequent evaluation and titration of therapies, application of advanced monitoring technologies and extensive interpretation of multiple databases. The patient has critical illness that could lead imminently to failure of 1 or more organ systems and requires the highest level of physician preparedness to intervene.  Critical Care Time devoted to patient care services described in this note is 45 minutes and is exclusive of time spent in procedures.

## 2016-08-11 NOTE — Progress Notes (Signed)
[facility] Progress Note Patient Name: Larry Daugherty DOB: 03-22-1930 MRN: [id number]   Date of Service  08/25/2016  HPI/Events of Note  Sepsis is NOT clear abx have been given LFt up  Lactic NOT greater 4, h/o CHF No bolus Maintain fluids  eICU Interventions       Intervention Category Intermediate Interventions: Hypotension - evaluation and management  Elinor Kleine J. 08/25/2016, 8:08 PM

## 2016-08-11 NOTE — ED Triage Notes (Signed)
Patient's wife reports patient hasn't wanted to eat anything x 2-3 days.

## 2016-08-11 NOTE — Progress Notes (Signed)
[facility] called about patient's elevated latic acid. MD made aware and will recheck latic acid in the morning. No further order indicated. Will continue to monitor patient.

## 2016-08-11 NOTE — Progress Notes (Signed)
Spoke with pts wife and daughter they stated pt is a DNR/DNI  Marda Stalker, [place] Pager [phone] (please enter 7 digits) [place] Pager [phone] (please enter 7 digits)

## 2016-08-11 NOTE — Progress Notes (Signed)
[facility] Progress Note Patient Name: Larry Daugherty DOB: 06-17-1930 MRN: [id number]   Date of Service  08/14/2016  HPI/Events of Note  New pt Unclear etiology change in MS No fevers LFt noted coveing with ABX BBB May need LP if not progressing LFt follo wup Ammonia Hydration okay conservative bnp nonspecific, on RA Aspiration? As infiltraes If not improved will MRI brain / eeg  eICU Interventions       Intervention Category Evaluation Type: New Patient Evaluation  FEINSTEIN,DANIEL J. 08/12/2016, 7:07 PM

## 2016-08-11 NOTE — Progress Notes (Signed)
Pharmacy Antibiotic Note  Larry Daugherty is a 83 y.o. male admitted on 08/29/2016 with possible meningitis.  Pharmacy has been consulted for ampicillin, ceftriaxone, vancomycin dosing.  Plan: Will order ampicillin 2g, ceftriaxone 2g, and vancomycin 1g IV x 1 stat.   Will start ampicillin 2g IV Q6hr to start at 0000 on 1/4.    Will start ceftriaxone 2g IV Q12hr to start at 0600 on 1/4.    Will start vancomycin 1g IV Q18hr to start at 0100 on 1/4. Goal trough os 15-20. Will obtain trough as clinically indicated.    Height: 5\' 10"  (177.8 cm) Weight: 184 lb 4.9 oz (83.6 kg) IBW/kg (Calculated) : 73  Temp (24hrs), Avg:97.9 F (36.6 C), Min:97.9 F (36.6 C), Max:97.9 F (36.6 C)   Recent Labs Lab 08/21/2016 1137 08/23/2016 1225  WBC 19.1*  --   CREATININE 1.49* 1.45*    Estimated Creatinine Clearance: 39.2 mL/min (by C-G formula based on SCr of 1.45 mg/dL (H)).    Allergies  Allergen Reactions  . Atorvastatin Anaphylaxis and Other (See Comments)    SEDATING  . Cholesterol Other (See Comments)           Allergic to    :    CHOLESTEROL MEDICINE (unspecified reaction)  . Erythromycin Base Other (See Comments)    "I didn't feel good"  . Hydralazine Rash  . Lisinopril Other (See Comments)    Acute renal failure  . Erythromycin Other (See Comments)  . Statins Other (See Comments)    Almost killed him    Antimicrobials this admission: Ampicllin 1/3 >>  Ceftriaxone 1/3 >>  Vancomycin 1/3 >>   Dose adjustments this admission: N/A  Microbiology results: 1/3 BCx: pending  1/3 MRSA PCR: pending   Pharmacy will continue to monitor and adjust per consult.    Jerred Zaremba L 08/09/2016 5:07 PM

## 2016-08-11 NOTE — Consult Note (Signed)
Larry Bellows MD  [address] [place], [place] [address] Phone: [phone] Fax : [phone]  Consultation  Referring Provider:     No ref. provider found Primary Care Physician:  Tracie Harrier, MD Primary Gastroenterologist:  Dr. Vicente Males         Reason for Consultation:     Abnormal LFT  Date of Admission:  08/22/2016 Date of Consultation:  08/20/2016         HPI:   Larry Daugherty is a 83 y.o. male with h/o CAD,CHF,dementia,CKD presented today to the Er with weakness for the past 5 days , drowsy all day long , some night sweats . Wife couldn't wake him up this morning and EMS was called.    Cthead - no acute changes USG abdomen -trace ascites and b/l pleural effusions.   Labs show low sodium, high potassium ,AST,ALT elevated . Acetaminophen levels <10 . [place] 19   Patient is unable to provide any history. I spoke to his wife. For the past week not been drinking or eating much , suffers from chronic diarrhea which she says has not been any worse this week. Denies any history of abdominal pain, shortness of breath . Denies any OTC med's, no use of tylenol or chinese medications. He has been very drowsy last few days.    Past Medical History:  Diagnosis Date  . Coronary artery disease   . Diabetes mellitus without complication [facility])     Past Surgical History:  Procedure Laterality Date  . cardiac bypass      Prior to Admission medications   Medication Sig Start Date End Date Taking? Authorizing Provider  aspirin EC 81 MG tablet Take 81 mg by mouth daily.   Yes Historical Provider, MD  ipratropium (ATROVENT) 0.03 % nasal spray Place 2 sprays into the nose 3 (three) times daily before meals.   Yes Historical Provider, MD  latanoprost (XALATAN) 0.005 % ophthalmic solution Place 1 drop into both eyes at bedtime.  01/15/16  Yes Historical Provider, MD  memantine (NAMENDA) 10 MG tablet Take 10 mg by mouth daily.  01/15/16  Yes Historical Provider, MD  metFORMIN (GLUCOPHAGE) 500 MG tablet  Take 500 mg by mouth 2 (two) times daily with a meal.  12/13/15  Yes Historical Provider, MD  timolol (TIMOPTIC) 0.5 % ophthalmic solution Place 1 drop into both eyes at bedtime.  12/16/15  Yes Historical Provider, MD    No family history on file.   Social History  Substance Use Topics  . Smoking status: Unknown If Ever Smoked  . Smokeless tobacco: Never Used  . Alcohol use No    Allergies as of 08/22/2016 - Review Complete 08/24/2016  Allergen Reaction Noted  . Atorvastatin Anaphylaxis and Other (See Comments) 02/28/2013  . Cholesterol Other (See Comments)   . Erythromycin base Other (See Comments) 02/16/2011  . Hydralazine Rash 01/19/2016  . Lisinopril Other (See Comments) 01/19/2016  . Erythromycin Other (See Comments) 01/19/2016  . Statins Other (See Comments) 05/19/2011    Review of Systems:    All systems reviewed and negative except where noted in HPI.   Physical Exam:  Vital signs in last 24 hours: Temp:  [97.9 F (36.6 C)] 97.9 F (36.6 C) (01/03 1119) Pulse Rate:  [77-84] 78 (01/03 1430) Resp:  [14-18] 14 (01/03 1430) BP: (139-150)/(92-111) 139/92 (01/03 1430) SpO2:  [93 %-95 %] 93 % (01/03 1430) Weight:  [184 lb 4.9 oz (83.6 kg)] 184 lb 4.9 oz (83.6 kg) (01/03 1113)   General:  Alert but not orientated , tongue very dry Head:  Normocephalic and atraumatic. Eyes:   No icterus.   Conjunctiva pink. PERRLA. Ears:  Normal auditory acuity. Neck:  Supple; no masses or thyroidomegaly Lungs: Respirations even and unlabored. Lungs clear to auscultation(limited exam as in bed) bilaterally.   No wheezes, crackles, or rhonchi.  Heart:  Regular rate and rhythm;  Without murmur, clicks, rubs or gallops Abdomen:  Soft, nondistended, nontender. Normal bowel sounds. No appreciable masses or hepatomegaly.  No rebound or guarding.  Rectal:  Not performed. Neurologic:  Alert and oriented x0;   Skin:  Intact without significant lesions or rashes. Cervical Nodes:  No significant  cervical adenopathy. Psych:  Alert   LAB RESULTS:  Recent Labs  09/01/2016 1137  WBC 19.1*  HGB 13.2  HCT 39.4*  PLT 263   BMET  Recent Labs  08/12/2016 1137 09/02/2016 1225  NA 131* 131*  K 6.4* 5.3*  CL 100* 99*  CO2 20* 21*  GLUCOSE 287* 285*  BUN 45* 46*  CREATININE 1.49* 1.45*  CALCIUM 9.5 9.6   LFT  Recent Labs  08/12/2016 1225  PROT 7.5  ALBUMIN 3.6  AST 1,911*  ALT 1,427*  ALKPHOS 106  BILITOT 1.5*   PT/INR  Recent Labs  08/22/2016 1408  LABPROT 23.5*  INR 2.06    STUDIES: Dg Chest 2 View  Result Date: 08/10/2016 CLINICAL DATA:  Altered mental status.  Weakness. EXAM: CHEST  2 VIEW COMPARISON:  12/22/2011 FINDINGS: Prior CABG. Cardiomegaly with vascular congestion and bilateral perihilar and lower lobe opacities compatible with edema. Small bilateral effusions. No acute bony abnormality. IMPRESSION: Cardiomegaly with vascular congestion. Bilateral perihilar and lower lobe opacities likely reflect edema/CHF. Electronically Signed   By: Rolm Baptise M.D.   On: 08/20/2016 12:09   Ct Head Wo Contrast  Result Date: 08/25/2016 CLINICAL DATA:  Altered mental status EXAM: CT HEAD WITHOUT CONTRAST TECHNIQUE: Contiguous axial images were obtained from the base of the skull through the vertex without intravenous contrast. COMPARISON:  Brain MRI 10/27/2012 FINDINGS: Brain: No intracranial hemorrhage, mass effect or midline shift. Moderate cerebral atrophy. Periventricular and patchy subcortical white matter decreased attenuation probable due to chronic small vessel ischemic changes. No acute cortical infarction. No mass lesion is noted on this unenhanced scan. Vascular: Atherosclerotic calcifications of carotid siphon and vertebral arteries. Skull: Normal. Negative for fracture or focal lesion. Sinuses/Orbits: No acute finding. Other: None. IMPRESSION: No acute intracranial abnormality. Moderate cerebral atrophy. Periventricular and patchy subcortical white matter decreased  attenuation probable due to chronic small vessel ischemic changes. No definite acute cortical infarction. Atherosclerotic calcifications of carotid siphon and vertebral arteries. Electronically Signed   By: Lahoma Crocker M.D.   On: 08/23/2016 12:21   Dg Shoulder Left  Result Date: 08/10/2016 CLINICAL DATA:  Weakness.  Fall.  Pain. EXAM: LEFT SHOULDER - 2+ VIEW COMPARISON:  No recent. FINDINGS: No acute bony or joint abnormality identified. No evidence of fracture or dislocation. Acromioclavicular glenohumeral degenerative change noted. IMPRESSION: Acromioclavicular and glenohumeral degenerative change. No acute abnormality. Electronically Signed   By: Marcello Moores  Register   On: 09/04/2016 12:10   US Abdomen Limited Ruq  Result Date: 08/29/2016 CLINICAL DATA:  Elevated liver function tests, prior cholecystectomy EXAM: US ABDOMEN LIMITED - RIGHT UPPER QUADRANT COMPARISON:  None. FINDINGS: Gallbladder: The gallbladder has previously been resected. Common bile duct: Diameter: The common bile duct measures 4.4 mm. Liver: The liver has a normal echogenic pattern. No focal hepatic abnormality is seen. There  is a trace amount of ascites present. Bilateral pleural effusions also are noted. IMPRESSION: Negative limited ultrasound of the abdomen post cholecystectomy other than trace ascites and bilateral pleural effusions. Electronically Signed   By: Ivar Drape M.D.   On: 08/24/2016 13:38      Impression / Plan:   Larry Daugherty is a 83 y.o. y/o male with dementia brought in for drowsiness , lethargy, not eating and drinking last few days, found to have very elevated LFT's , INR 2.0, clinically appears dehydrated.   Plan   1. Abnormal LFT's,encephelopathy and elevated INR suggestive of liver failure. Due to multiple comorbidities not a candidate for liver transplant if were to deteriorate further- ER discussed with [facility] for transfer and they didn't accept .   The pattern of abnormal LFT's are usually seen in shock  liver or from toxins or from acute hepatitis. Treatment at this time is supportive with IV fluids, check for acute hepatitis A/B/C/Viral hepatitis such as EBV,CMV,HSV.  Thank you for involving me in the care of this patient.  Check INR Q 12 hours, treat any underlying infection-check urine , correct electrolytes and limit use of narcotics. Recheck LFT's in the am . Usually shock liver responds rapidly with fluid resuscitation. BMP is also elevated and occasional congestion hepatopathy can cause elevated LFT's too. Check CK levels. Vitamin K S.C 10 mg once daily for 3 doses to correct any vitamin K deficiency .     LOS: 0 days   Larry Bellows, MD  09/06/2016, 3:11 PM

## 2016-08-12 ENCOUNTER — Inpatient Hospital Stay: Payer: Medicare Other

## 2016-08-12 LAB — HEPATITIS PANEL, ACUTE
HEP A IGM: NEGATIVE
HEP B C IGM: NEGATIVE
Hepatitis B Surface Ag: NEGATIVE

## 2016-08-12 LAB — URINE DRUG SCREEN, QUALITATIVE (ARMC ONLY)
AMPHETAMINES, UR SCREEN: NOT DETECTED
Barbiturates, Ur Screen: NOT DETECTED
Benzodiazepine, Ur Scrn: NOT DETECTED
COCAINE METABOLITE, UR ~~LOC~~: NOT DETECTED
Cannabinoid 50 Ng, Ur ~~LOC~~: NOT DETECTED
MDMA (ECSTASY) UR SCREEN: NOT DETECTED
Methadone Scn, Ur: NOT DETECTED
Opiate, Ur Screen: NOT DETECTED
PHENCYCLIDINE (PCP) UR S: NOT DETECTED
TRICYCLIC, UR SCREEN: NOT DETECTED

## 2016-08-12 LAB — COMPREHENSIVE METABOLIC PANEL
ALK PHOS: 116 U/L (ref 38–126)
ALT: 2554 U/L — ABNORMAL HIGH (ref 17–63)
ANION GAP: 9 (ref 5–15)
AST: 3623 U/L — AB (ref 15–41)
Albumin: 3.3 g/dL — ABNORMAL LOW (ref 3.5–5.0)
BILIRUBIN TOTAL: 1.1 mg/dL (ref 0.3–1.2)
BUN: 48 mg/dL — ABNORMAL HIGH (ref 6–20)
CALCIUM: 9 mg/dL (ref 8.9–10.3)
CO2: 25 mmol/L (ref 22–32)
Chloride: 100 mmol/L — ABNORMAL LOW (ref 101–111)
Creatinine, Ser: 1.61 mg/dL — ABNORMAL HIGH (ref 0.61–1.24)
GFR calc Af Amer: 44 mL/min — ABNORMAL LOW (ref >60)
GFR, EST NON AFRICAN AMERICAN: 38 mL/min — AB (ref >60)
Glucose, Bld: 151 mg/dL — ABNORMAL HIGH (ref 65–99)
Potassium: 4.6 mmol/L (ref 3.5–5.1)
SODIUM: 134 mmol/L — AB (ref 135–145)
TOTAL PROTEIN: 6.8 g/dL (ref 6.5–8.1)

## 2016-08-12 LAB — CBC
HEMATOCRIT: 41.2 % (ref 40.0–52.0)
Hemoglobin: 13.5 g/dL (ref 13.0–18.0)
MCH: 31.3 pg (ref 26.0–34.0)
MCHC: 32.8 g/dL (ref 32.0–36.0)
MCV: 95.4 fL (ref 80.0–100.0)
Platelets: 259 10*3/uL (ref 150–440)
RBC: 4.32 MIL/uL — ABNORMAL LOW (ref 4.40–5.90)
RDW: 13.9 % (ref 11.5–14.5)
WBC: 23.8 10*3/uL — AB (ref 3.8–10.6)

## 2016-08-12 LAB — HEPATITIS B DNA, ULTRAQUANTITATIVE, PCR
HBV DNA SERPL PCR-ACNC: NOT DETECTED [IU]/mL
HBV DNA SERPL PCR-LOG IU: UNDETERMINED {Log_IU}/mL

## 2016-08-12 LAB — PROTIME-INR
INR: 3.05
PROTHROMBIN TIME: 32.2 s — AB (ref 11.4–15.2)

## 2016-08-12 LAB — PROCALCITONIN: Procalcitonin: 0.28 ng/mL

## 2016-08-12 LAB — PHOSPHORUS: PHOSPHORUS: 5.4 mg/dL — AB (ref 2.5–4.6)

## 2016-08-12 LAB — LACTIC ACID, PLASMA: Lactic Acid, Venous: 3 mmol/L (ref 0.5–1.9)

## 2016-08-12 LAB — GLUCOSE, CAPILLARY
GLUCOSE-CAPILLARY: 155 mg/dL — AB (ref 65–99)
GLUCOSE-CAPILLARY: 165 mg/dL — AB (ref 65–99)

## 2016-08-12 LAB — TROPONIN I: TROPONIN I: 1.27 ng/mL — AB (ref <0.03)

## 2016-08-12 LAB — MAGNESIUM: MAGNESIUM: 2.1 mg/dL (ref 1.7–2.4)

## 2016-08-12 MED ORDER — ACETAMINOPHEN 650 MG RE SUPP: 650.0000 mg | Freq: Four times a day (QID) | RECTAL | Status: DC | PRN

## 2016-08-12 MED ORDER — MORPHINE 100MG IN NS 100ML (1MG/ML) PREMIX INFUSION
3.0000 mg/h | INTRAVENOUS | Status: DC
Start: 2016-08-12 — End: 2016-08-13
  Administered 2016-08-12: 3 mg/h via INTRAVENOUS
  Filled 2016-08-12: qty 100

## 2016-08-12 MED ORDER — HALOPERIDOL LACTATE 5 MG/ML IJ SOLN: 0.5000 mg | INTRAMUSCULAR | Status: DC | PRN

## 2016-08-12 MED ORDER — GLYCOPYRROLATE 1 MG PO TABS: 1.0000 mg | ORAL_TABLET | ORAL | Status: DC | PRN

## 2016-08-12 MED ORDER — ALBUTEROL SULFATE (2.5 MG/3ML) 0.083% IN NEBU: 2.5000 mg | INHALATION_SOLUTION | RESPIRATORY_TRACT | Status: DC | PRN

## 2016-08-12 MED ORDER — GLYCOPYRROLATE 0.2 MG/ML IJ SOLN
0.2000 mg | INTRAMUSCULAR | Status: DC | PRN
  Administered 2016-08-12: 0.2 mg via INTRAVENOUS
  Filled 2016-08-12: qty 1

## 2016-08-12 MED ORDER — ONDANSETRON 4 MG PO TBDP
4.0000 mg | ORAL_TABLET | Freq: Four times a day (QID) | ORAL | Status: DC | PRN
  Filled 2016-08-12: qty 1

## 2016-08-12 MED ORDER — ACETAMINOPHEN 325 MG PO TABS: 650.0000 mg | ORAL_TABLET | Freq: Four times a day (QID) | ORAL | Status: DC | PRN

## 2016-08-12 MED ORDER — ONDANSETRON HCL 4 MG/2ML IJ SOLN: 4.0000 mg | Freq: Four times a day (QID) | INTRAMUSCULAR | Status: DC | PRN

## 2016-08-12 MED ORDER — GLYCOPYRROLATE 0.2 MG/ML IJ SOLN: 0.2000 mg | INTRAMUSCULAR | Status: DC | PRN

## 2016-08-12 MED ORDER — MORPHINE BOLUS VIA INFUSION
4.0000 mg | INTRAVENOUS | Status: DC | PRN
  Filled 2016-08-12: qty 4

## 2016-08-12 MED ORDER — HALOPERIDOL LACTATE 2 MG/ML PO CONC
0.5000 mg | ORAL | Status: DC | PRN
  Filled 2016-08-12: qty 0.3

## 2016-08-12 MED ORDER — HALOPERIDOL 1 MG PO TABS: 0.5000 mg | ORAL_TABLET | ORAL | Status: DC | PRN

## 2016-08-12 MED ORDER — CEFTRIAXONE SODIUM-DEXTROSE 2-2.22 GM-% IV SOLR
2.0000 g | Freq: Two times a day (BID) | INTRAVENOUS | Status: DC
  Filled 2016-08-12: qty 50

## 2016-08-12 MED ORDER — POLYVINYL ALCOHOL 1.4 % OP SOLN
1.0000 [drp] | Freq: Four times a day (QID) | OPHTHALMIC | Status: DC | PRN
  Filled 2016-08-12: qty 15

## 2016-08-12 MED ORDER — METOPROLOL TARTRATE 5 MG/5ML IV SOLN: 2.5000 mg | INTRAVENOUS | Status: DC | PRN

## 2016-08-12 NOTE — Progress Notes (Signed)
Patient resting comfortably, started 2 liters [place] to maintain O2 sats > 90% after morphine drip started.  No morphine boluses have been necessary.  Patient is resting comfortably and is no longer moaning or calling out.  Family in room.  Writer utilizing mouth swabs and lip moisturizer PRN for comfort.

## 2016-08-12 NOTE — Consult Note (Signed)
[facility] Cardiology  CARDIOLOGY CONSULT NOTE  Patient ID: Larry Daugherty MRN: [id number] DOB/AGE: 81-Jun-1931 81 y.o.  Admit date: 08/21/2016 Referring Physician Ashby Dawes    Primary Physician [facility] Primary Cardiologist Nehemiah Massed Reason for Consultation Elevated troponin  HPI: 83 year old male gentleman referred for elevated troponin. Patient has a history of ischemic cardiomyopathy, status post CABG ~15 year ago, moderate aortic stenosis, hypertension, congestive heart failure with an EF of 30% per echo 12/2015. Patient was brought to [facility] ER on 09/07/2016 for altered mental status for 1 week, and found to have severely elevated liver enzymes of unknown etiology. Admission labs notable for troponin 1.29, WBC 23.8, AST 1455 and ALT 1166. Presently, the patient is not able to answer ROS as he is obtunded. Vitals are stable.   Review of systems incomplete as patient unable to answer in current altered mental state.       Past Medical History:  Diagnosis Date  . Coronary artery disease   . Diabetes mellitus without complication [facility])     Past Surgical History:  Procedure Laterality Date  . cardiac bypass      Prescriptions Prior to Admission  Medication Sig Dispense Refill Last Dose  . aspirin EC 81 MG tablet Take 81 mg by mouth daily.   unknown at unknown  . ipratropium (ATROVENT) 0.03 % nasal spray Place 2 sprays into the nose 3 (three) times daily before meals.   unknown at unknown  . latanoprost (XALATAN) 0.005 % ophthalmic solution Place 1 drop into both eyes at bedtime.   4 unknown at unknown  . memantine (NAMENDA) 10 MG tablet Take 10 mg by mouth daily.   3 unknown at unknown  . metFORMIN (GLUCOPHAGE) 500 MG tablet Take 500 mg by mouth 2 (two) times daily with a meal.   2 unknown at unknown  . timolol (TIMOPTIC) 0.5 % ophthalmic solution Place 1 drop into both eyes at bedtime.   4 unknown at unknown   Social History   Social History  . Marital status: Married    Spouse name: N/A  .  Number of children: N/A  . Years of education: N/A   Occupational History  . Not on file.   Social History Main Topics  . Smoking status: Never Smoker  . Smokeless tobacco: Never Used  . Alcohol use No  . Drug use: Unknown  . Sexual activity: Not on file   Other Topics Concern  . Not on file   Social History Narrative  . No narrative on file    No family history on file.      PHYSICAL EXAM  General: Obtunded HEENT:  Normocephalic and atramatic Neck:  No JVD.  Lungs: Diffuse crackles in bilateral lower lobes Heart: HRRR . Normal S1 and S2, 2/6 systolic murmur Abdomen: Bowel sounds are positive, abdomen soft, non-distended Msk:  Patient lying in bed Extremities: No clubbing, cyanosis or edema.   Neuro: Obtunded Psych: Obtunded  Labs:   Lab Results  Component Value Date   WBC 23.8 (H) 08/12/2016   HGB 13.5 08/12/2016   HCT 41.2 08/12/2016   MCV 95.4 08/12/2016   PLT 259 08/12/2016    Recent Labs Lab 08/12/16 0616  NA 134*  K 4.6  CL 100*  CO2 25  BUN 48*  CREATININE 1.61*  CALCIUM 9.0  PROT 6.8  BILITOT 1.1  ALKPHOS 116  ALT 2,554*  AST 3,623*  GLUCOSE 151*   Lab Results  Component Value Date   CKTOTAL 42 (L) 08/12/2016  CKMB 2.9 08/11/2011   TROPONINI 1.27 (HH) 08/12/2016   No results found for: CHOL No results found for: HDL No results found for: LDLCALC No results found for: TRIG No results found for: CHOLHDL No results found for: LDLDIRECT    Radiology: Dg Chest 2 View  Result Date: 09/03/2016 CLINICAL DATA:  Altered mental status.  Weakness. EXAM: CHEST  2 VIEW COMPARISON:  12/22/2011 FINDINGS: Prior CABG. Cardiomegaly with vascular congestion and bilateral perihilar and lower lobe opacities compatible with edema. Small bilateral effusions. No acute bony abnormality. IMPRESSION: Cardiomegaly with vascular congestion. Bilateral perihilar and lower lobe opacities likely reflect edema/CHF. Electronically Signed   By: Rolm Baptise M.D.   On:  08/10/2016 12:09   Ct Head Wo Contrast  Result Date: 08/23/2016 CLINICAL DATA:  Altered mental status EXAM: CT HEAD WITHOUT CONTRAST TECHNIQUE: Contiguous axial images were obtained from the base of the skull through the vertex without intravenous contrast. COMPARISON:  Brain MRI 10/27/2012 FINDINGS: Brain: No intracranial hemorrhage, mass effect or midline shift. Moderate cerebral atrophy. Periventricular and patchy subcortical white matter decreased attenuation probable due to chronic small vessel ischemic changes. No acute cortical infarction. No mass lesion is noted on this unenhanced scan. Vascular: Atherosclerotic calcifications of carotid siphon and vertebral arteries. Skull: Normal. Negative for fracture or focal lesion. Sinuses/Orbits: No acute finding. Other: None. IMPRESSION: No acute intracranial abnormality. Moderate cerebral atrophy. Periventricular and patchy subcortical white matter decreased attenuation probable due to chronic small vessel ischemic changes. No definite acute cortical infarction. Atherosclerotic calcifications of carotid siphon and vertebral arteries. Electronically Signed   By: Lahoma Crocker M.D.   On: 08/10/2016 12:21   Dg Chest Port 1 View  Result Date: 08/12/2016 CLINICAL DATA:  Pulmonary edema.  History of diabetes. EXAM: PORTABLE CHEST 1 VIEW COMPARISON:  Chest radiograph August 11, 2016 FINDINGS: The cardiac silhouette is mildly enlarged and unchanged. Status post median sternotomy for cardiac valve replacement. Calcified aortic knob. Increasing pulmonary vascular congestion and interstitial prominence. Increasing small layering pleural effusions and underlying consolidation. No pneumothorax. ACDF. Soft tissue planes and included osseous structures are nonsuspicious. IMPRESSION: Worsening CHF with increasing small pleural effusions. Bibasilar atelectasis and/or confluent edema. Electronically Signed   By: Elon Alas M.D.   On: 08/12/2016 04:27   Dg Shoulder  Left  Result Date: 09/08/2016 CLINICAL DATA:  Weakness.  Fall.  Pain. EXAM: LEFT SHOULDER - 2+ VIEW COMPARISON:  No recent. FINDINGS: No acute bony or joint abnormality identified. No evidence of fracture or dislocation. Acromioclavicular glenohumeral degenerative change noted. IMPRESSION: Acromioclavicular and glenohumeral degenerative change. No acute abnormality. Electronically Signed   By: Marcello Moores  Register   On: 09/04/2016 12:10   US Abdomen Limited Ruq  Result Date: 08/25/2016 CLINICAL DATA:  Elevated liver function tests, prior cholecystectomy EXAM: US ABDOMEN LIMITED - RIGHT UPPER QUADRANT COMPARISON:  None. FINDINGS: Gallbladder: The gallbladder has previously been resected. Common bile duct: Diameter: The common bile duct measures 4.4 mm. Liver: The liver has a normal echogenic pattern. No focal hepatic abnormality is seen. There is a trace amount of ascites present. Bilateral pleural effusions also are noted. IMPRESSION: Negative limited ultrasound of the abdomen post cholecystectomy other than trace ascites and bilateral pleural effusions. Electronically Signed   By: Ivar Drape M.D.   On: 09/07/2016 13:38    EKG: normal sinus rhythm with nonspecific ST abnormalities laterally  ASSESSMENT AND PLAN:  1. Alerted mental status of unknown etiology in setting of severely elevated liver enzymes. 2. Elevated troponin, likely  demand-supply ischemia, not ACS.    RECOMMENDATIONS: 1. Agree with current therapy. 2. Defer full-dose anticoagulation. 3. Consider repeat echocardiogram 4. Further recommendations pending patient course and echo.  Signed: Clabe Seal, PA-C 08/12/2016, 1:55 PM

## 2016-08-12 NOTE — Progress Notes (Signed)
Report called to Meghan on 1C, transport pt via bed.  No O2 required

## 2016-08-12 NOTE — Progress Notes (Signed)
Jonathon Bellows MD [address], [place], [place] [address] Phone: [phone] Fax : [phone]  Larry Daugherty is being followed for acute liver failure,encephelopathy  Day 2 of follow up   Subjective: Pt not able to give any history , mumbling incoherently , very drowsy.    Objective: Vital signs in last 24 hours: Vitals:   08/12/16 0400 08/12/16 0500 08/12/16 0600 08/12/16 0800  BP: (!) 158/104 (!) 153/98 (!) 155/108   Pulse: 86 87 87   Resp: 18 19 (!) 22   Temp: 97.7 F (36.5 C)   98.3 F (36.8 C)  TempSrc: Axillary   Axillary  SpO2: 93% 94% (!) 86%   Weight:  178 lb 2.1 oz (80.8 kg)    Height:       Weight change:   Intake/Output Summary (Last 24 hours) at 08/12/16 [id number] Last data filed at 08/12/16 0800  Gross per 24 hour  Intake             1525 ml  Output              315 ml  Net             1210 ml     Exam: Heart:: S1S2 present Lungs: clear to auscultation Abdomen: soft, nontender, normal bowel sounds   Lab Results: @LABTEST2 @ Micro Results: Recent Results (from the past 240 hour(s))  Rapid Influenza A&B Antigens ([facility] only)     Status: None   Collection Time: 08/30/2016 12:16 PM  Result Value Ref Range Status   Influenza A ([facility]) NEGATIVE NEGATIVE Final   Influenza B ([facility]) NEGATIVE NEGATIVE Final  MRSA PCR Screening     Status: None   Collection Time: 08/10/2016  6:17 PM  Result Value Ref Range Status   MRSA by PCR NEGATIVE NEGATIVE Final    Comment:        The GeneXpert MRSA Assay (FDA approved for NASAL specimens only), is one component of a comprehensive MRSA colonization surveillance program. It is not intended to diagnose MRSA infection nor to guide or monitor treatment for MRSA infections.   Culture, blood (routine x 2)     Status: None (Preliminary result)   Collection Time: 08/23/2016  6:36 PM  Result Value Ref Range Status   Specimen Description BLOOD LEFT AC  Final   Special Requests   Final    BOTTLES DRAWN AEROBIC AND  ANAEROBIC AER 7ML ANA 5ML   Culture NO GROWTH < 12 HOURS  Final   Report Status PENDING  Incomplete  Culture, blood (routine x 2)     Status: None (Preliminary result)   Collection Time: 08/28/2016  6:55 PM  Result Value Ref Range Status   Specimen Description BLOOD RIGHT AC  Final   Special Requests   Final    BOTTLES DRAWN AEROBIC AND ANAEROBIC AER 8ML ANA 10 ML   Culture NO GROWTH < 12 HOURS  Final   Report Status PENDING  Incomplete   Studies/Results: Dg Chest 2 View  Result Date: 08/29/2016 CLINICAL DATA:  Altered mental status.  Weakness. EXAM: CHEST  2 VIEW COMPARISON:  12/22/2011 FINDINGS: Prior CABG. Cardiomegaly with vascular congestion and bilateral perihilar and lower lobe opacities compatible with edema. Small bilateral effusions. No acute bony abnormality. IMPRESSION: Cardiomegaly with vascular congestion. Bilateral perihilar and lower lobe opacities likely reflect edema/CHF. Electronically Signed   By: Rolm Baptise M.D.   On: 09/02/2016 12:09   Ct Head Wo Contrast  Result Date: 08/31/2016  CLINICAL DATA:  Altered mental status EXAM: CT HEAD WITHOUT CONTRAST TECHNIQUE: Contiguous axial images were obtained from the base of the skull through the vertex without intravenous contrast. COMPARISON:  Brain MRI 10/27/2012 FINDINGS: Brain: No intracranial hemorrhage, mass effect or midline shift. Moderate cerebral atrophy. Periventricular and patchy subcortical white matter decreased attenuation probable due to chronic small vessel ischemic changes. No acute cortical infarction. No mass lesion is noted on this unenhanced scan. Vascular: Atherosclerotic calcifications of carotid siphon and vertebral arteries. Skull: Normal. Negative for fracture or focal lesion. Sinuses/Orbits: No acute finding. Other: None. IMPRESSION: No acute intracranial abnormality. Moderate cerebral atrophy. Periventricular and patchy subcortical white matter decreased attenuation probable due to chronic small vessel ischemic  changes. No definite acute cortical infarction. Atherosclerotic calcifications of carotid siphon and vertebral arteries. Electronically Signed   By: Lahoma Crocker M.D.   On: 08/12/2016 12:21   Dg Chest Port 1 View  Result Date: 08/12/2016 CLINICAL DATA:  Pulmonary edema.  History of diabetes. EXAM: PORTABLE CHEST 1 VIEW COMPARISON:  Chest radiograph August 11, 2016 FINDINGS: The cardiac silhouette is mildly enlarged and unchanged. Status post median sternotomy for cardiac valve replacement. Calcified aortic knob. Increasing pulmonary vascular congestion and interstitial prominence. Increasing small layering pleural effusions and underlying consolidation. No pneumothorax. ACDF. Soft tissue planes and included osseous structures are nonsuspicious. IMPRESSION: Worsening CHF with increasing small pleural effusions. Bibasilar atelectasis and/or confluent edema. Electronically Signed   By: Elon Alas M.D.   On: 08/12/2016 04:27   Dg Shoulder Left  Result Date: 08/15/2016 CLINICAL DATA:  Weakness.  Fall.  Pain. EXAM: LEFT SHOULDER - 2+ VIEW COMPARISON:  No recent. FINDINGS: No acute bony or joint abnormality identified. No evidence of fracture or dislocation. Acromioclavicular glenohumeral degenerative change noted. IMPRESSION: Acromioclavicular and glenohumeral degenerative change. No acute abnormality. Electronically Signed   By: Marcello Moores  Register   On: 09/04/2016 12:10   US Abdomen Limited Ruq  Result Date: 08/29/2016 CLINICAL DATA:  Elevated liver function tests, prior cholecystectomy EXAM: US ABDOMEN LIMITED - RIGHT UPPER QUADRANT COMPARISON:  None. FINDINGS: Gallbladder: The gallbladder has previously been resected. Common bile duct: Diameter: The common bile duct measures 4.4 mm. Liver: The liver has a normal echogenic pattern. No focal hepatic abnormality is seen. There is a trace amount of ascites present. Bilateral pleural effusions also are noted. IMPRESSION: Negative limited ultrasound of the  abdomen post cholecystectomy other than trace ascites and bilateral pleural effusions. Electronically Signed   By: Ivar Drape M.D.   On: 08/20/2016 13:38   Medications: I have reviewed the patient's current medications. Scheduled Meds: . ampicillin (OMNIPEN) IV  2 g Intravenous Q6H  . cefTRIAXone  2 g Intravenous Q12H  . insulin aspart  0-9 Units Subcutaneous Q4H  . latanoprost  1 drop Both Eyes QHS  . phytonadione  10 mg Subcutaneous Daily  . timolol  1 drop Both Eyes QHS  . vancomycin  1,000 mg Intravenous Q18H   Continuous Infusions: . sodium chloride 75 mL/hr at 08/12/16 0800   PRN Meds:.sodium chloride, ipratropium-albuterol, metoprolol, ondansetron (ZOFRAN) IV   Assessment: Active Problems:   Acute encephalopathy    Plan: Admitted with conerns for infection on antibiotics,i was called for abnormal LFT's that suggest acute liver failure with INR 3.0 today , also see he is in acute renal failure, elevated troponin's.Overall poor prognosis. .Discussed with Dr Ashby Dawes and informed decision to change him to comfort care.   I will sign off.  Please call me if any  further GI concerns or questions.  We would like to thank you for the opportunity to participate in the care of Larry Daugherty.    LOS: 1 day   Jonathon Bellows 08/12/2016, 9:07 AM

## 2016-08-12 NOTE — Progress Notes (Signed)
Chaplain received an end of life consult. When Chaplain on-call went to see the patient, his two daughters were by his side. One of the daughters said that her Dad was resting and he was a good man. Chaplain provided a ministry of presence and prayer.

## 2016-08-12 NOTE — Progress Notes (Signed)
Late entry. Pt admitted to room ICU 11. Family at bedside and wants to change code status from Full Code to DNR. NP Bermudes called to room to talk with patient's family and change code status. Daughter to remain with pt overnight. Pt is very hard of hearing, and answers occasional questions, otherwise remains quiet. Does have history of dementia per family. Has been quiet for most of the time and only tried to get out of bed once, bed aalrm in place for safety warning. Blood cultures drawn and IV ABX started.

## 2016-08-12 NOTE — Progress Notes (Signed)
Patient transitioned to comfort care measures, transfer order placed, morphine drip started, fluids reduced to [facility], pt appears more comfortable

## 2016-08-13 ENCOUNTER — Telehealth: Payer: Self-pay

## 2016-08-13 LAB — LEGIONELLA PNEUMOPHILA SEROGP 1 UR AG: L. pneumophila Serogp 1 Ur Ag: NEGATIVE

## 2016-08-13 LAB — URINE CULTURE: Culture: NO GROWTH

## 2016-08-13 LAB — HEPATITIS C VRS RNA DETECT BY PCR-QUAL: Hepatitis C Vrs RNA by PCR-Qual: NEGATIVE

## 2016-08-13 LAB — HEPATITIS PANEL, ACUTE
HCV Ab: <0.1 s/co ratio (ref 0.0–0.9)
HEP B C IGM: NEGATIVE
HEP B S AG: NEGATIVE
Hep A IgM: NEGATIVE

## 2016-08-16 LAB — CULTURE, BLOOD (ROUTINE X 2)
CULTURE: NO GROWTH
Culture: NO GROWTH

## 2016-08-19 ENCOUNTER — Ambulatory Visit: Payer: Self-pay | Admitting: Cardiovascular Disease

## 2016-09-09 NOTE — Discharge Summary (Addendum)
Physician Discharge Summary  Patient ID: Larry Daugherty MRN: [id number] DOB/AGE: Mar 28, 1930 83 y.o.  Admit date: 08/21/2016 Discharge date: 2016/09/06  Admission Diagnoses: Same as discharge.   Discharge Diagnoses:  Active Problems:   Acute encephalopathy Acute liver failure.  AKI Acute systolic CHF.   Discharged Condition: deceased  Hospital Course:  Pt was admitted with altered mental status with elevated liver enzymes, INR, consistent with liver failure. RUQ [place] was negative, doppler US was ordered to evaluate liver.  However the morning after admission, pt wife stated that patient would want to be kept comfortable, therefore he was made comfort measures only.    Consults: GI  Significant Diagnostic Studies: ultrasound.  Treatments:   Discharge Exam: Blood pressure (!) 155/90, pulse 88, temperature 97.5 F (36.4 C), temperature source Oral, resp. rate (!) 8, height 5\' 11"  (1.803 m), weight 178 lb 2.1 oz (80.8 kg), SpO2 96 %.   Disposition: 20-Expired   Allergies as of 2016/09/06      Reactions   Atorvastatin Anaphylaxis, Other (See Comments)   SEDATING   Cholesterol Other (See Comments)          Allergic to    :    CHOLESTEROL MEDICINE (unspecified reaction)   Erythromycin Base Other (See Comments)   "I didn't feel good"   Hydralazine Rash   Lisinopril Other (See Comments)   Acute renal failure   Erythromycin Other (See Comments)   Statins Other (See Comments)   Almost killed him      Medication List    ASK your doctor about these medications   aspirin EC 81 MG tablet Take 81 mg by mouth daily.   ipratropium 0.03 % nasal spray Commonly known as:  ATROVENT Place 2 sprays into the nose 3 (three) times daily before meals.   latanoprost 0.005 % ophthalmic solution Commonly known as:  XALATAN Place 1 drop into both eyes at bedtime.   memantine 10 MG tablet Commonly known as:  NAMENDA Take 10 mg by mouth daily.   metFORMIN 500 MG tablet Commonly known as:   GLUCOPHAGE Take 500 mg by mouth 2 (two) times daily with a meal.   timolol 0.5 % ophthalmic solution Commonly known as:  TIMOPTIC Place 1 drop into both eyes at bedtime.        Signed: Laverle Hobby September 06, 2016, 1:26 PM

## 2016-09-09 NOTE — Telephone Encounter (Signed)
Death Certificate placed in lbpu box please call Larry Daugherty and Larry Daugherty when ready for pick up

## 2016-09-09 NOTE — Progress Notes (Signed)
Time of death pronounced 0345, pt's wife at bedside.  RN provided emotional support to wife, wife remained at bedside as long as needed.  Pt's ring given to wife by nursing supervisor.  PIV's removed, body prepared, transported to morgue by unit staff.

## 2016-09-09 DEATH — deceased

## 2016-09-21 ENCOUNTER — Ambulatory Visit (INDEPENDENT_AMBULATORY_CARE_PROVIDER_SITE_OTHER): Payer: Medicare Other | Admitting: Vascular Surgery

## 2016-09-21 ENCOUNTER — Encounter (INDEPENDENT_AMBULATORY_CARE_PROVIDER_SITE_OTHER): Payer: Medicare Other

## 2016-10-27 ENCOUNTER — Ambulatory Visit: Payer: Medicare Other | Admitting: Podiatry

## 2018-07-14 IMAGING — CR DG CHEST 2V
1 series · 2 of 2 positions shown · non-contrast
Comparison: 12/22/2011

CLINICAL DATA: Altered mental status.  Weakness.

EXAM:
CHEST  2 VIEW

[Series 1: dg chest 2 view · 0.14mm/px · 2 of 2 slices shown]
[im 1/2]
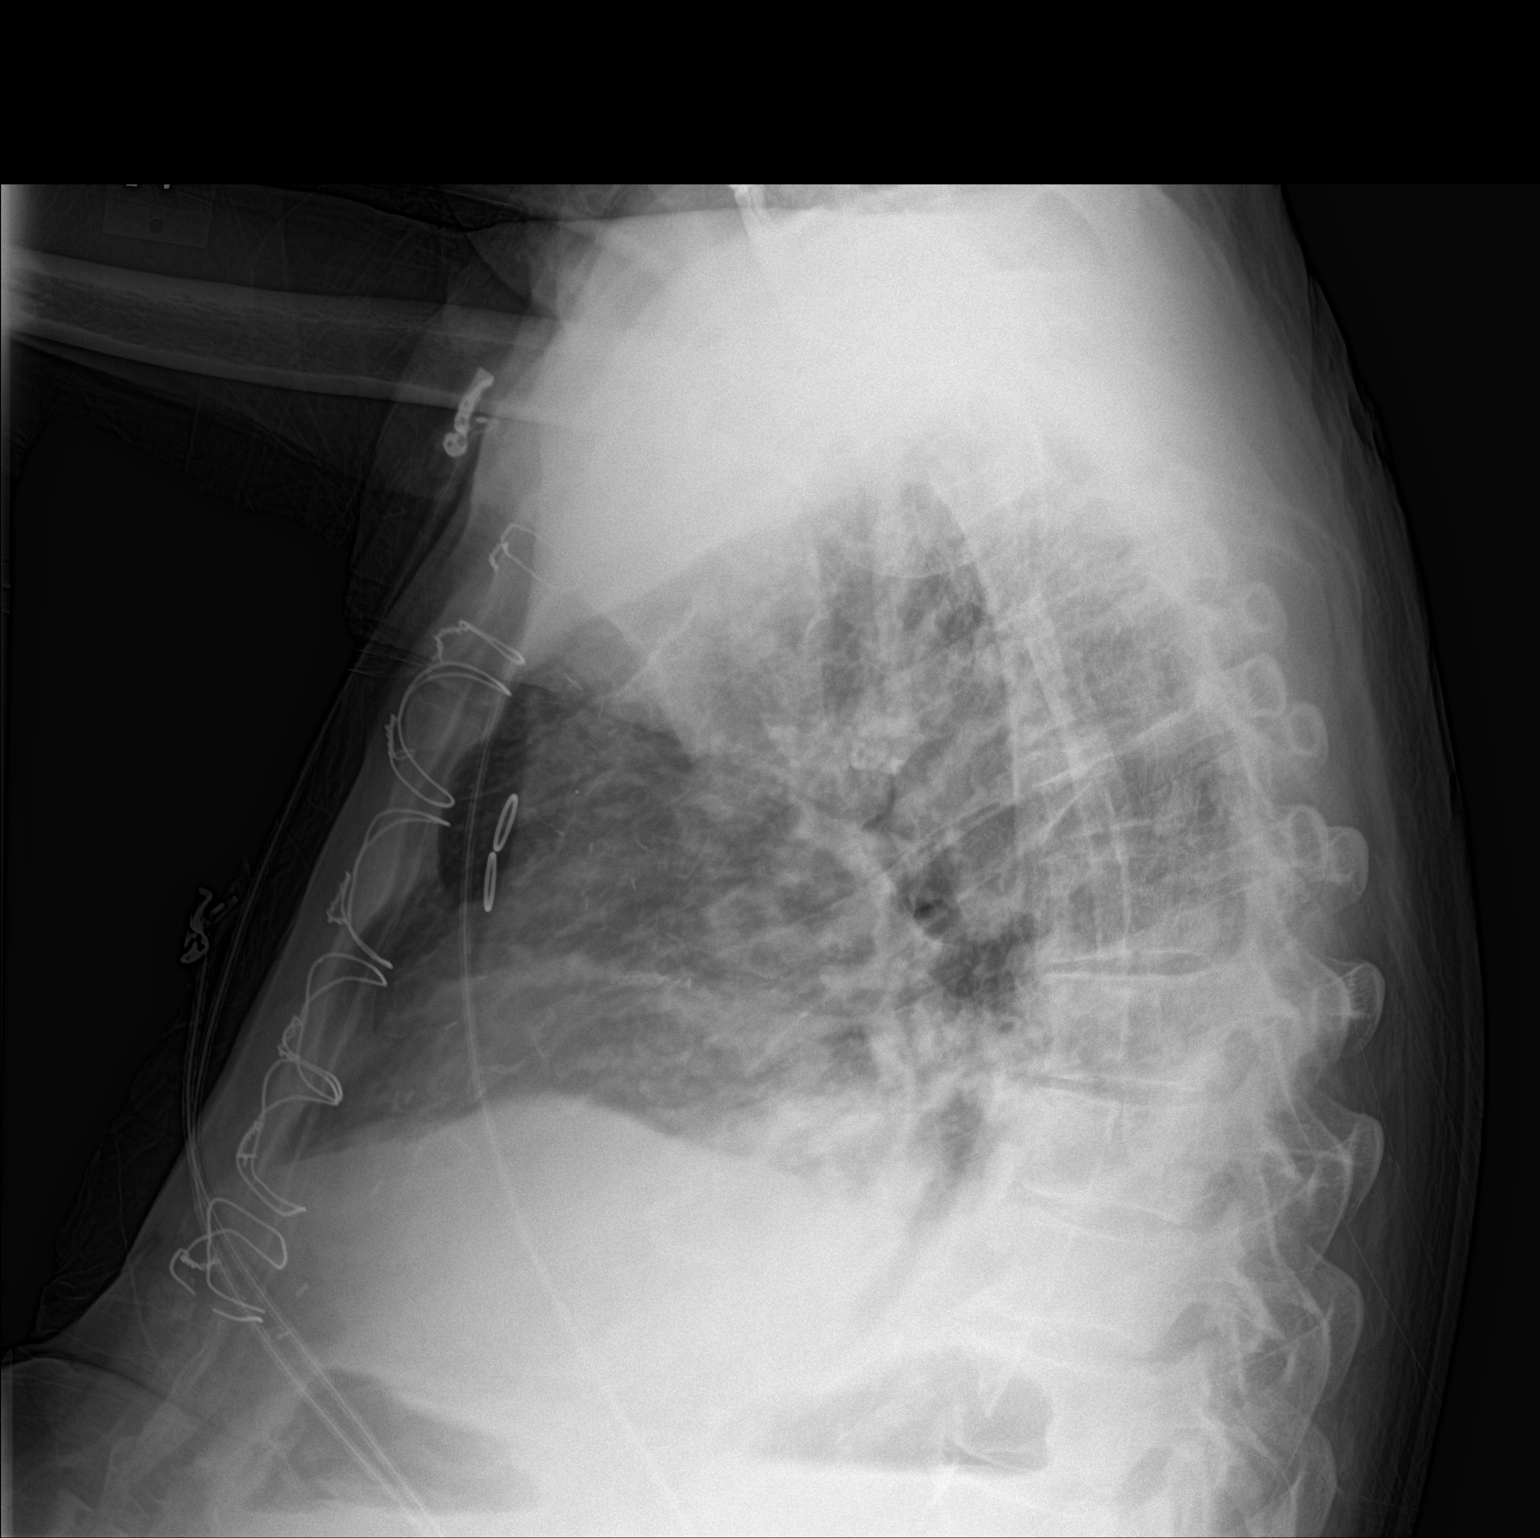
[im 2/2]
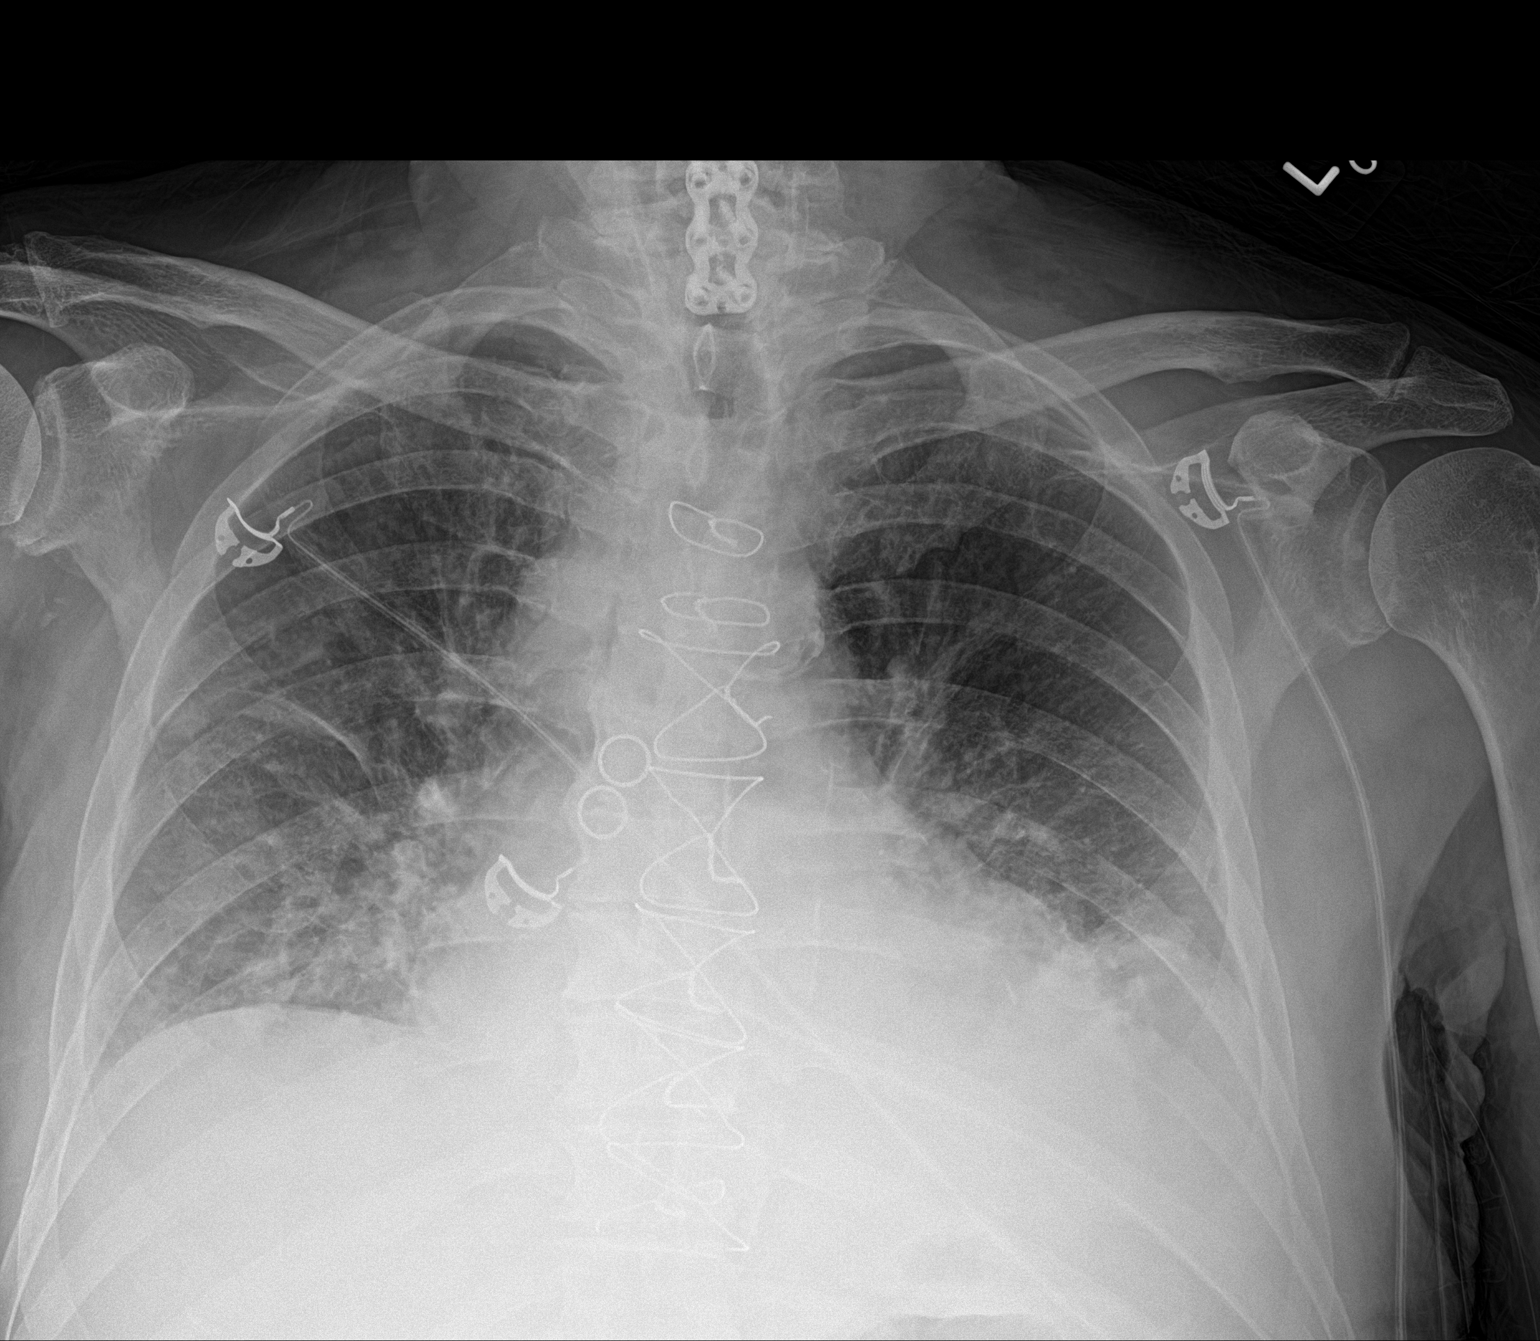

[2 of 2 positions shown; findings below may reference images not displayed]

FINDINGS: Prior CABG. Cardiomegaly with vascular congestion and bilateral
perihilar and lower lobe opacities compatible with edema. Small
bilateral effusions. No acute bony abnormality.
IMPRESSION: Cardiomegaly with vascular congestion. Bilateral perihilar and lower
lobe opacities likely reflect edema/CHF.

## 2018-07-14 IMAGING — CR DG SHOULDER 2+V*L*
1 series · 3 of 3 positions shown · non-contrast
Comparison: No recent.

CLINICAL DATA: Weakness.  Fall.  Pain.

EXAM:
LEFT SHOULDER - 2+ VIEW

[Series 1: dg shoulder left · 0.14mm/px · 3 of 3 slices shown]
[im 1/3]
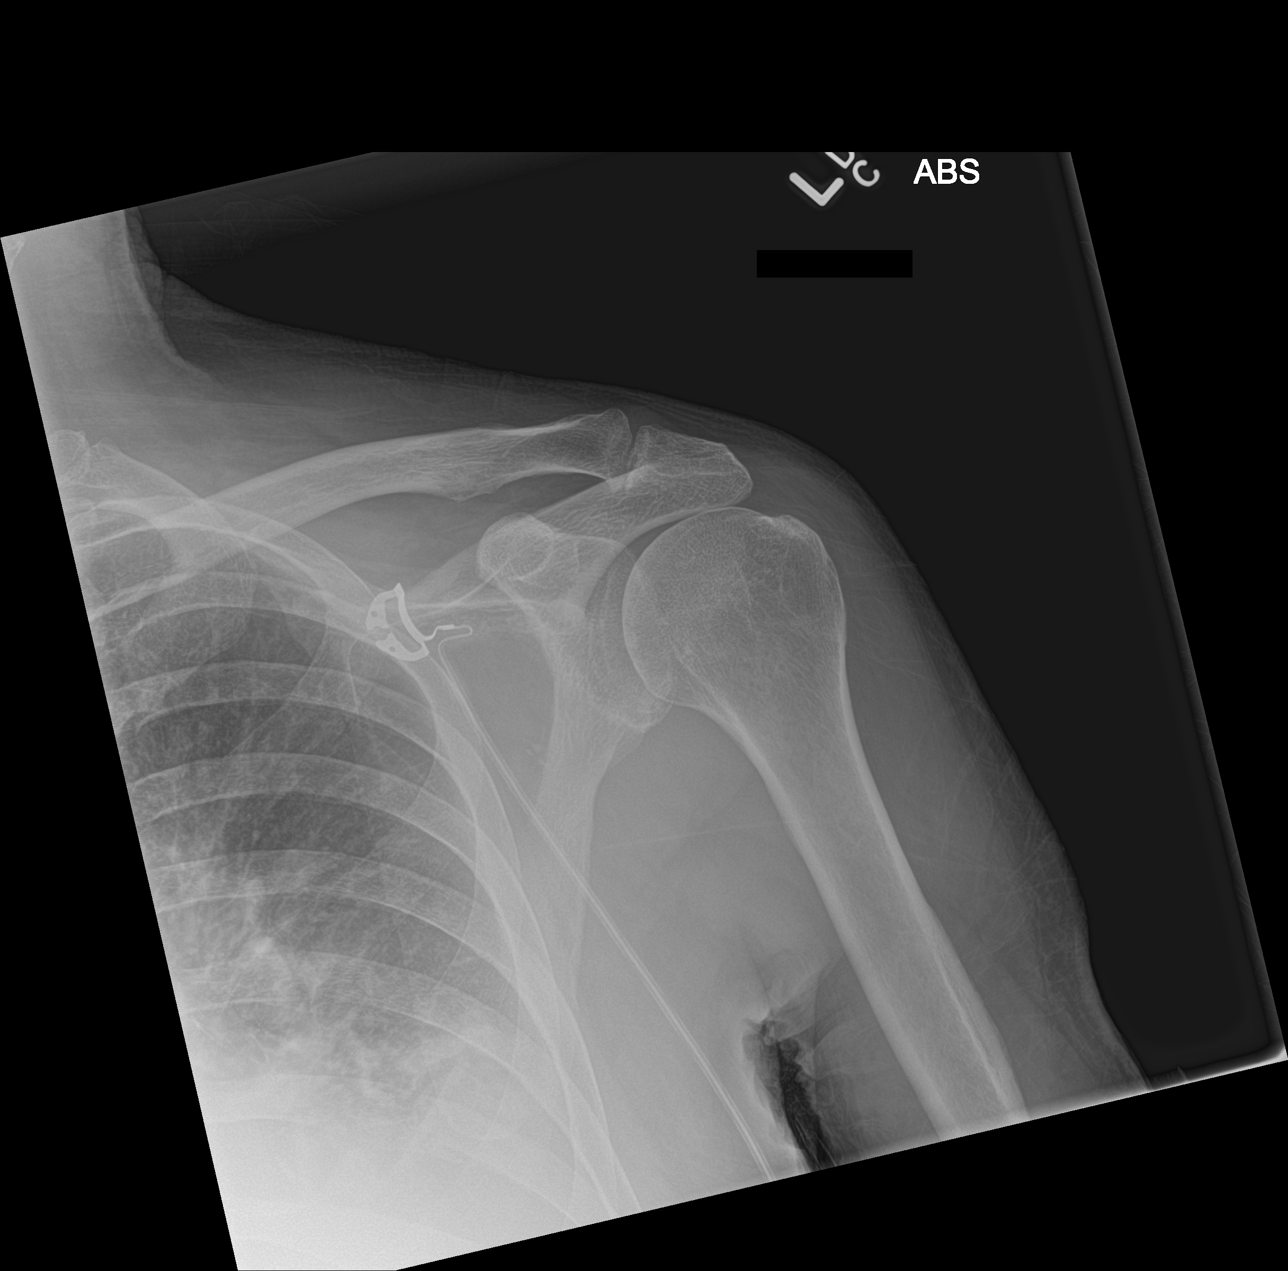
[im 2/3]
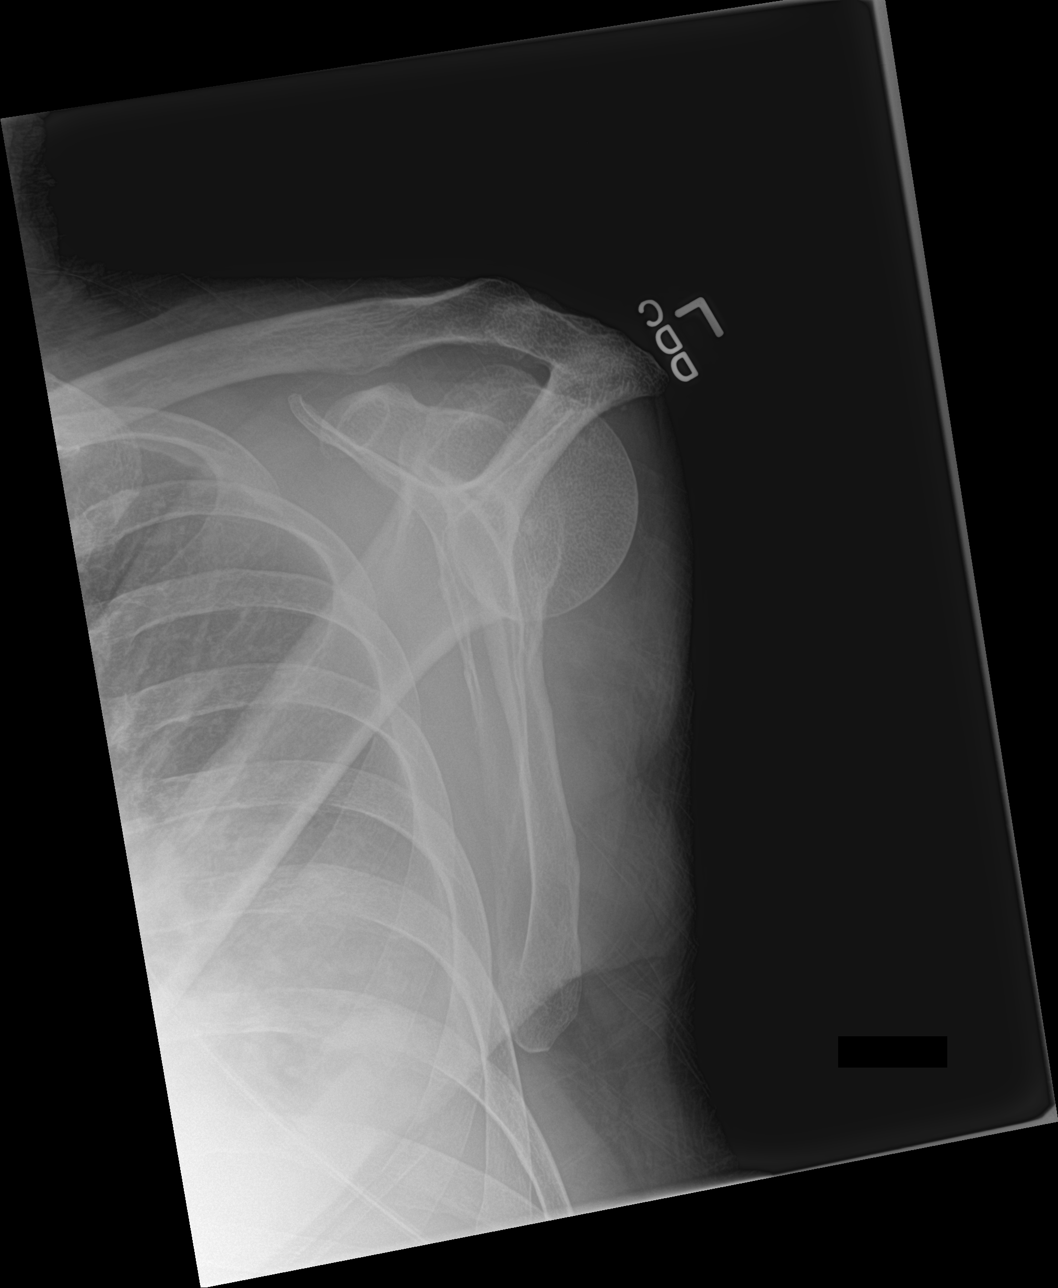
[im 3/3]
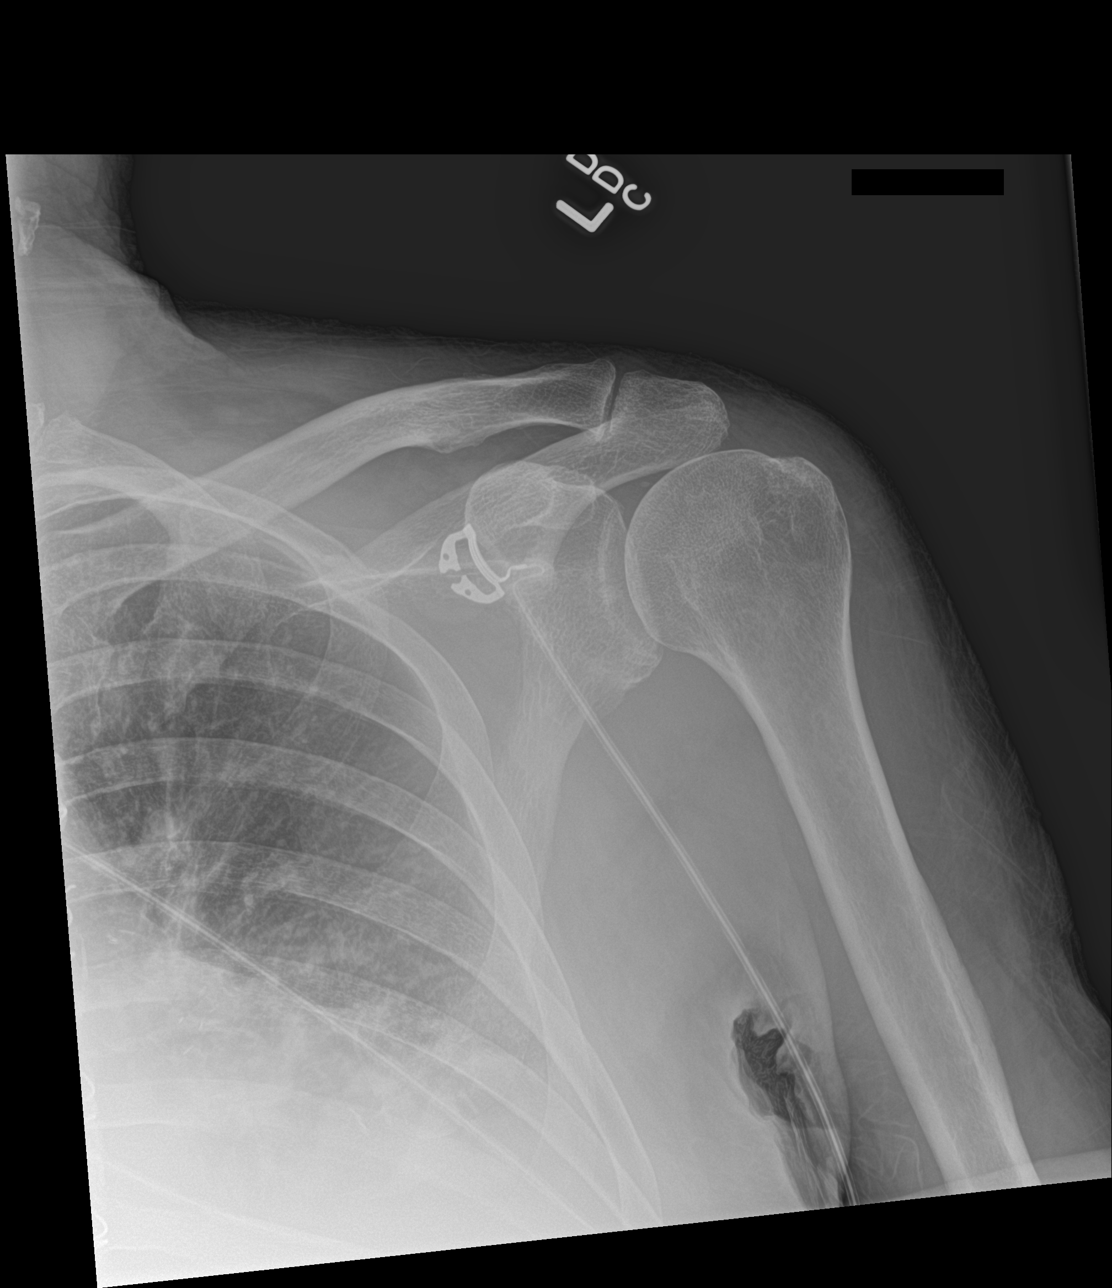

[3 of 3 positions shown; findings below may reference images not displayed]

FINDINGS: No acute bony or joint abnormality identified. No evidence of
fracture or dislocation. Acromioclavicular glenohumeral degenerative
change noted.
IMPRESSION: Acromioclavicular and glenohumeral degenerative change. No acute
abnormality.

## 2018-07-15 IMAGING — DX DG CHEST 1V PORT
1 series · 1 of 1 positions shown · non-contrast
Comparison: Chest radiograph August 11, 2016

CLINICAL DATA: Pulmonary edema.  History of diabetes.

EXAM:
PORTABLE CHEST 1 VIEW

[chest ap]
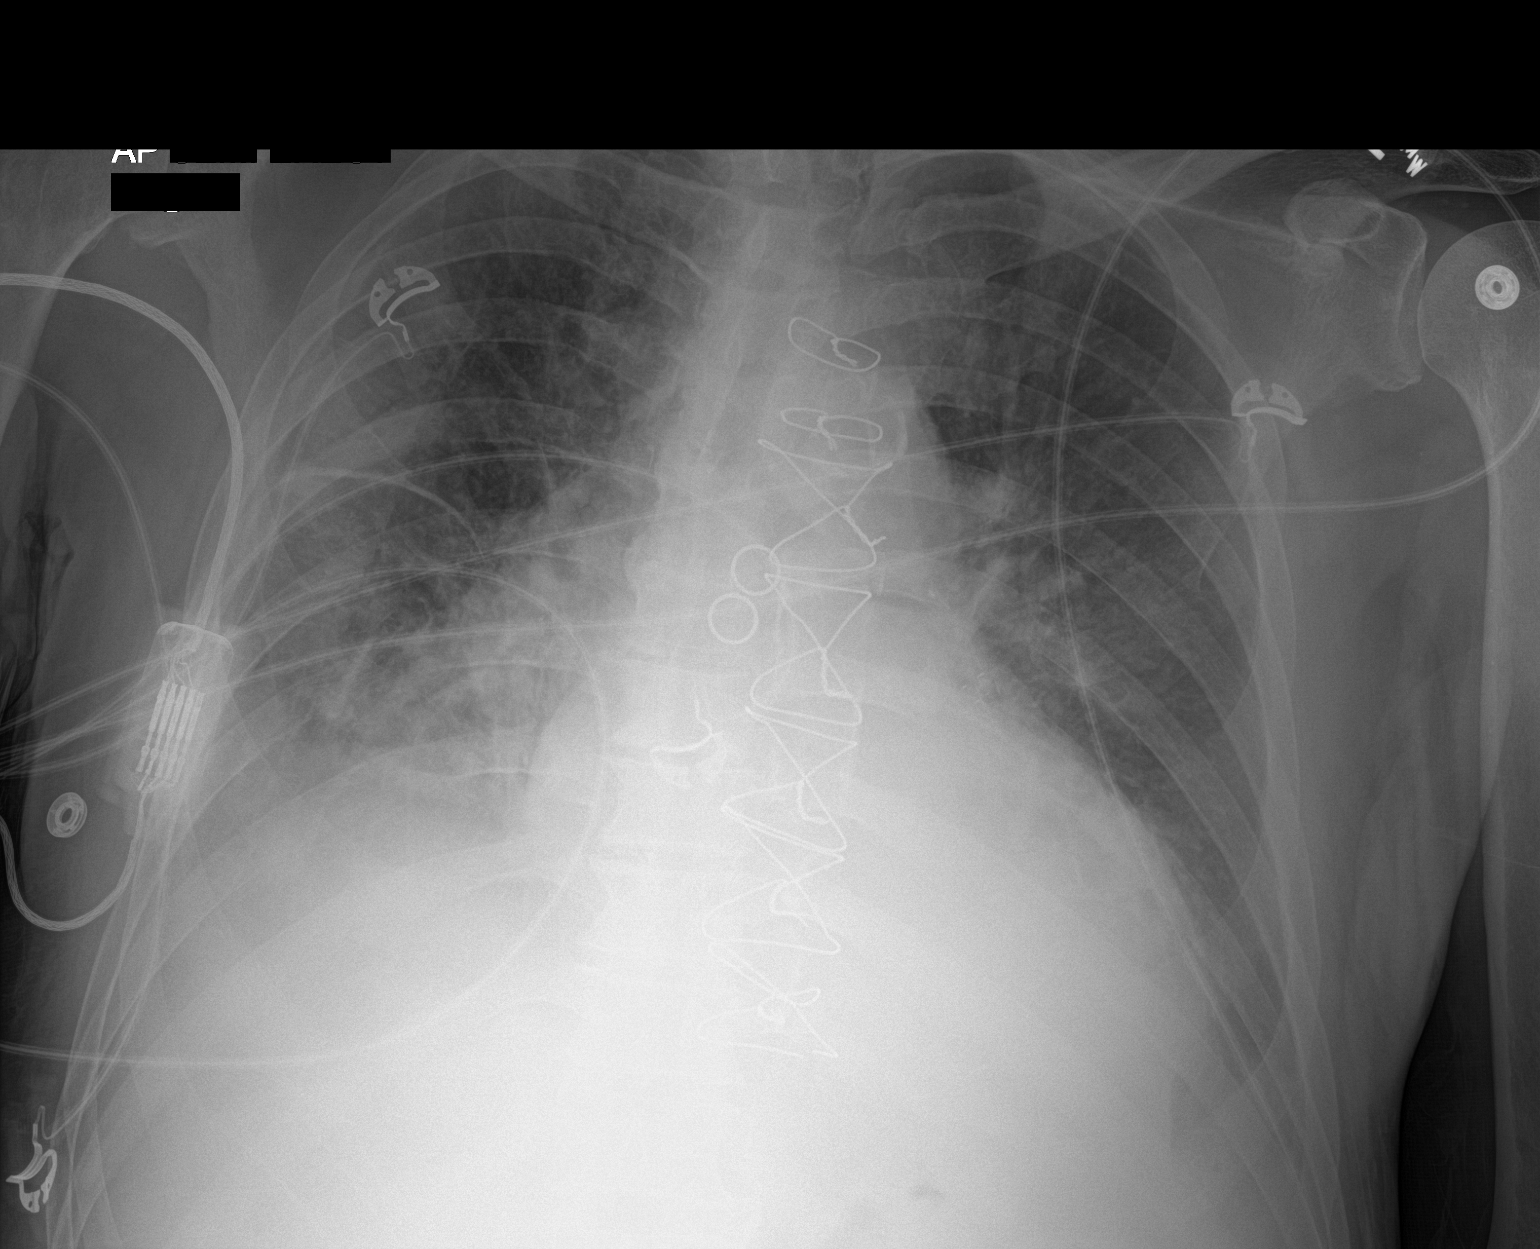

[1 of 1 positions shown; findings below may reference images not displayed]

FINDINGS: The cardiac silhouette is mildly enlarged and unchanged. Status post
median sternotomy for cardiac valve replacement. Calcified aortic
knob. Increasing pulmonary vascular congestion and interstitial
prominence. Increasing small layering pleural effusions and
underlying consolidation. No pneumothorax. ACDF. Soft tissue planes
and included osseous structures are nonsuspicious.
IMPRESSION: Worsening CHF with increasing small pleural effusions. Bibasilar
atelectasis and/or confluent edema.
